# Patient Record
Sex: Female | Born: 1960 | Race: White | Hispanic: No | Marital: Married | Smoking: Current every day smoker
Health system: Southern US, Community
[De-identification: ages and names within clinical notes are randomized; demographics above are authoritative.]

## PROBLEM LIST (undated history)

## (undated) DIAGNOSIS — F419 Anxiety disorder, unspecified: Secondary | ICD-10-CM

## (undated) DIAGNOSIS — F329 Major depressive disorder, single episode, unspecified: Secondary | ICD-10-CM

## (undated) DIAGNOSIS — K219 Gastro-esophageal reflux disease without esophagitis: Secondary | ICD-10-CM

## (undated) DIAGNOSIS — F32A Depression, unspecified: Secondary | ICD-10-CM

## (undated) DIAGNOSIS — H269 Unspecified cataract: Secondary | ICD-10-CM

## (undated) DIAGNOSIS — M1990 Unspecified osteoarthritis, unspecified site: Secondary | ICD-10-CM

## (undated) DIAGNOSIS — G2581 Restless legs syndrome: Secondary | ICD-10-CM

## (undated) DIAGNOSIS — D649 Anemia, unspecified: Secondary | ICD-10-CM

## (undated) HISTORY — DX: Gastro-esophageal reflux disease without esophagitis: K21.9

## (undated) HISTORY — DX: Unspecified osteoarthritis, unspecified site: M19.90

## (undated) HISTORY — PX: SPINAL CORD STIMULATOR REMOVAL: SHX2423

## (undated) HISTORY — DX: Unspecified cataract: H26.9

## (undated) HISTORY — DX: Anemia, unspecified: D64.9

## (undated) HISTORY — DX: Anxiety disorder, unspecified: F41.9

## (undated) HISTORY — DX: Depression, unspecified: F32.A

## (undated) HISTORY — PX: SPINAL CORD STIMULATOR IMPLANT: SHX2422

## (undated) HISTORY — PX: CHOLECYSTECTOMY: SHX55

## (undated) HISTORY — PX: OTHER SURGICAL HISTORY: SHX169

---
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
## 1999-01-22 ENCOUNTER — Other Ambulatory Visit: Admission: RE | Admit: 1999-01-22 | Discharge: 1999-01-22 | Payer: Self-pay

## 2001-11-14 DIAGNOSIS — Z5189 Encounter for other specified aftercare: Secondary | ICD-10-CM

## 2001-11-14 HISTORY — DX: Encounter for other specified aftercare: Z51.89

## 2003-11-15 DIAGNOSIS — G709 Myoneural disorder, unspecified: Secondary | ICD-10-CM

## 2003-11-15 HISTORY — DX: Myoneural disorder, unspecified: G70.9

## 2008-08-04 ENCOUNTER — Ambulatory Visit (HOSPITAL_COMMUNITY): Admission: RE | Admit: 2008-08-04 | Discharge: 2008-08-04 | Payer: Self-pay | Admitting: Anesthesiology

## 2009-01-20 ENCOUNTER — Ambulatory Visit (HOSPITAL_COMMUNITY): Admission: RE | Admit: 2009-01-20 | Discharge: 2009-01-20 | Payer: Self-pay | Admitting: Anesthesiology

## 2009-04-15 ENCOUNTER — Emergency Department (HOSPITAL_COMMUNITY): Admission: EM | Admit: 2009-04-15 | Discharge: 2009-04-15 | Payer: Self-pay | Admitting: Emergency Medicine

## 2009-11-14 HISTORY — PX: OTHER SURGICAL HISTORY: SHX169

## 2010-07-22 ENCOUNTER — Encounter (HOSPITAL_COMMUNITY)
Admission: RE | Admit: 2010-07-22 | Discharge: 2010-10-20 | Payer: Self-pay | Source: Home / Self Care | Admitting: Neurology

## 2011-02-21 LAB — CBC
HCT: 31.4 % — ABNORMAL LOW (ref 36.0–46.0)
Hemoglobin: 10.2 g/dL — ABNORMAL LOW (ref 12.0–15.0)
MCV: 83 fL (ref 78.0–100.0)
Platelets: 360 10*3/uL (ref 150–400)
RBC: 3.79 MIL/uL — ABNORMAL LOW (ref 3.87–5.11)
WBC: 6.3 10*3/uL (ref 4.0–10.5)

## 2011-02-21 LAB — COMPREHENSIVE METABOLIC PANEL
Albumin: 3 g/dL — ABNORMAL LOW (ref 3.5–5.2)
Alkaline Phosphatase: 79 U/L (ref 39–117)
BUN: 4 mg/dL — ABNORMAL LOW (ref 6–23)
CO2: 26 mEq/L (ref 19–32)
Chloride: 109 mEq/L (ref 96–112)
GFR calc non Af Amer: >60 mL/min (ref >60)
Glucose, Bld: 82 mg/dL (ref 70–99)
Potassium: 3.8 mEq/L (ref 3.5–5.1)
Total Bilirubin: 0.3 mg/dL (ref 0.3–1.2)

## 2011-02-21 LAB — POCT CARDIAC MARKERS

## 2011-02-21 LAB — URINALYSIS, ROUTINE W REFLEX MICROSCOPIC
Glucose, UA: NEGATIVE mg/dL
Hgb urine dipstick: NEGATIVE
Specific Gravity, Urine: 1.005 (ref 1.005–1.030)
pH: 5.5 (ref 5.0–8.0)

## 2011-02-21 LAB — LIPASE, BLOOD: Lipase: 61 U/L — ABNORMAL HIGH (ref 11–59)

## 2011-02-21 LAB — DIFFERENTIAL
Basophils Absolute: 0.1 10*3/uL (ref 0.0–0.1)
Basophils Relative: 2 % — ABNORMAL HIGH (ref 0–1)
Monocytes Absolute: 0.6 10*3/uL (ref 0.1–1.0)
Neutro Abs: 3.2 10*3/uL (ref 1.7–7.7)

## 2011-08-12 ENCOUNTER — Other Ambulatory Visit: Payer: Self-pay | Admitting: Neurology

## 2011-08-12 DIAGNOSIS — M545 Low back pain: Secondary | ICD-10-CM

## 2011-08-12 DIAGNOSIS — R269 Unspecified abnormalities of gait and mobility: Secondary | ICD-10-CM

## 2011-08-12 DIAGNOSIS — W19XXXA Unspecified fall, initial encounter: Secondary | ICD-10-CM

## 2011-08-18 ENCOUNTER — Ambulatory Visit
Admission: RE | Admit: 2011-08-18 | Discharge: 2011-08-18 | Disposition: A | Discharge status: Home / Self Care | Payer: Medicare HMO | Source: Ambulatory Visit | Attending: Neurology | Admitting: Neurology

## 2011-08-18 DIAGNOSIS — M545 Low back pain: Secondary | ICD-10-CM

## 2011-08-18 DIAGNOSIS — R269 Unspecified abnormalities of gait and mobility: Secondary | ICD-10-CM

## 2011-08-18 DIAGNOSIS — W19XXXA Unspecified fall, initial encounter: Secondary | ICD-10-CM

## 2011-08-18 MED ORDER — GADOBENATE DIMEGLUMINE 529 MG/ML IV SOLN
19.0000 mL | Freq: Once | INTRAVENOUS | Status: AC | PRN
  Administered 2011-08-18: 19 mL via INTRAVENOUS

## 2012-01-16 DIAGNOSIS — G8929 Other chronic pain: Secondary | ICD-10-CM

## 2012-01-16 DIAGNOSIS — G5790 Unspecified mononeuropathy of unspecified lower limb: Secondary | ICD-10-CM

## 2012-01-16 DIAGNOSIS — M5416 Radiculopathy, lumbar region: Secondary | ICD-10-CM

## 2012-01-16 HISTORY — DX: Unspecified mononeuropathy of unspecified lower limb: G57.90

## 2012-01-16 HISTORY — DX: Radiculopathy, lumbar region: M54.16

## 2012-01-16 HISTORY — DX: Other chronic pain: G89.29

## 2012-08-28 HISTORY — PX: OTHER SURGICAL HISTORY: SHX169

## 2012-10-10 ENCOUNTER — Other Ambulatory Visit: Payer: Self-pay | Admitting: Anesthesiology

## 2012-10-10 DIAGNOSIS — R209 Unspecified disturbances of skin sensation: Secondary | ICD-10-CM

## 2012-10-10 DIAGNOSIS — M542 Cervicalgia: Secondary | ICD-10-CM

## 2012-10-16 ENCOUNTER — Ambulatory Visit (HOSPITAL_COMMUNITY): Admission: RE | Admit: 2012-10-16 | Payer: BC Managed Care – PPO | Source: Ambulatory Visit

## 2012-10-16 ENCOUNTER — Inpatient Hospital Stay (HOSPITAL_COMMUNITY): Admission: RE | Admit: 2012-10-16 | Payer: BC Managed Care – PPO | Source: Ambulatory Visit

## 2012-10-23 ENCOUNTER — Other Ambulatory Visit (HOSPITAL_COMMUNITY): Payer: BC Managed Care – PPO

## 2012-10-23 ENCOUNTER — Ambulatory Visit (HOSPITAL_BASED_OUTPATIENT_CLINIC_OR_DEPARTMENT_OTHER): Payer: Medicare Other | Attending: Anesthesiology | Admitting: Radiology

## 2012-10-23 VITALS — Ht 64.0 in | Wt 200.0 lb

## 2012-10-23 DIAGNOSIS — G2581 Restless legs syndrome: Secondary | ICD-10-CM

## 2012-10-23 DIAGNOSIS — G4737 Central sleep apnea in conditions classified elsewhere: Secondary | ICD-10-CM | POA: Insufficient documentation

## 2012-10-23 DIAGNOSIS — G4733 Obstructive sleep apnea (adult) (pediatric): Secondary | ICD-10-CM | POA: Insufficient documentation

## 2012-10-27 DIAGNOSIS — G4737 Central sleep apnea in conditions classified elsewhere: Secondary | ICD-10-CM

## 2012-10-27 DIAGNOSIS — R0609 Other forms of dyspnea: Secondary | ICD-10-CM

## 2012-10-27 DIAGNOSIS — G4733 Obstructive sleep apnea (adult) (pediatric): Secondary | ICD-10-CM

## 2012-10-27 DIAGNOSIS — R0989 Other specified symptoms and signs involving the circulatory and respiratory systems: Secondary | ICD-10-CM

## 2012-10-27 NOTE — Procedures (Signed)
NAME:  [name], Nancy Cervantes                   ACCOUNT NO.:  [id number]  MEDICAL RECORD NO.:  [id number]          PATIENT TYPE:  OUT  LOCATION:  SLEEP CENTER                 FACILITY:  [facility]  PHYSICIAN:  Clinton D. Maple Hudson, MD, FCCP, FACPDATE OF BIRTH:  1963-03-22  DATE OF STUDY:  10/23/2012                           NOCTURNAL POLYSOMNOGRAM  REFERRING PHYSICIAN:  KWADWO GYARTENG-DAKWA  REFERRING PHYSICIAN:  Tonye Royalty, MD  INDICATION FOR STUDY:  Insomnia with sleep apnea.  EPWORTH SLEEPINESS SCORE:  24/24.  BMI 34.3, weight 200 pounds, height 64 inches, neck 15 inches.  MEDICATIONS:  Home medications are charted and reviewed.  SLEEP ARCHITECTURE:  Total sleep time 325.5 minutes with sleep efficiency 91.3%.  Stage I was 17.8%, stage II 73.3%, stage III absent, REM 8.9% of total sleep time.  Sleep latency 3 minutes, REM latency 202.5 minutes, awake after sleep onset 17 minutes.  Arousal index 29.9. Bedtime medication:  Nexium, pramipexole, ranitidine, baclofen.  RESPIRATORY DATA:  Apnea/hypopnea index (AHI) 26.4 per hour.  A total of 143 events was scored including 19 obstructive apneas, 30 central apneas, 7 mixed apneas, 87 hypopneas.  Events were not positional.  REM AHI 41.4 per hour.  This is a diagnostic NPSG protocol as ordered.  CPAP was not done.  OXYGEN DATA:  Moderately loud snoring with oxygen desaturation to a nadir of 83% and mean oxygen saturation through the study of 92.3% on room air.  CARDIAC DATA:  Sinus rhythm with occasional PAC and PVC.  MOVEMENT/PARASOMNIA:  A few incidental limb jerks were noted without sleep disturbance.  No bathroom trips.  IMPRESSION/RECOMMENDATION: 1. The patient had no difficulty falling asleep for the study with a     sleep latency of 3 minutes after lights out.  Spontaneously awake     at 4 a.m. 2. 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  REM AHI 41.4 per  hour.     Moderately loud snoring with oxygen desaturation to a nadir of 83%     and mean oxygen saturation through the study of 92.3% on room air. 3. This was a diagnostic NPSG protocol as ordered.  Consider return     for dedicated CPAP titration study if appropriate.     Clinton D. Maple Hudson, MD, [facility], FACP Diplomate, American Board of Sleep Medicine    CDY/MEDQ  D:  10/27/2012 11:32:02  T:  10/27/2012 19:03:38  Job:  [id number]

## 2012-12-11 ENCOUNTER — Encounter (HOSPITAL_BASED_OUTPATIENT_CLINIC_OR_DEPARTMENT_OTHER): Payer: Medicare Other

## 2013-01-02 ENCOUNTER — Ambulatory Visit (HOSPITAL_BASED_OUTPATIENT_CLINIC_OR_DEPARTMENT_OTHER): Payer: Medicare Other | Attending: Anesthesiology | Admitting: Radiology

## 2013-01-02 VITALS — Ht 64.0 in | Wt 200.0 lb

## 2013-01-02 DIAGNOSIS — G4710 Hypersomnia, unspecified: Secondary | ICD-10-CM | POA: Insufficient documentation

## 2013-01-02 DIAGNOSIS — Z79899 Other long term (current) drug therapy: Secondary | ICD-10-CM | POA: Insufficient documentation

## 2013-01-06 DIAGNOSIS — G4710 Hypersomnia, unspecified: Secondary | ICD-10-CM

## 2013-01-06 DIAGNOSIS — Z79899 Other long term (current) drug therapy: Secondary | ICD-10-CM

## 2013-01-06 DIAGNOSIS — G4730 Sleep apnea, unspecified: Secondary | ICD-10-CM

## 2013-01-07 NOTE — Procedures (Signed)
NAME:  Nancy Cervantes, Nancy Cervantes                   ACCOUNT NO.:  [id number]  MEDICAL RECORD NO.:  [id number]          PATIENT TYPE:  OUT  LOCATION:  SLEEP CENTER                 FACILITY:  [facility]  PHYSICIAN:  Clinton D. Maple Hudson, MD, FCCP, FACPDATE OF BIRTH:  [birth year]-Oct-1961  DATE OF STUDY:  01/02/2013                           NOCTURNAL POLYSOMNOGRAM  REFERRING PHYSICIAN:  Tonye Royalty, MD  INDICATION FOR STUDY:  Hypersomnia with sleep apnea.  EPWORTH SLEEPINESS SCORE:  24/24.  BMI 34.3, weight 200 pounds, height 64 inches, neck 15 inches.  MEDICATIONS:  Home medications charted and reviewed.  A baseline diagnostic NPSG on October 23, 2012, had recorded AHI 26.4 per hour. Body weight was 200 pounds.  CPAP titration is now requested.  SLEEP ARCHITECTURE:  Total sleep time 297 minutes with sleep efficiency 78.8%.  Stage I was 12.6%, stage II 75.8%, stage III 2.2%, REM 9.4% of total sleep time.  Sleep latency 10 minutes, REM latency 165.5 minutes, awake after sleep onset 70 minutes, arousal index 40.  BEDTIME MEDICATION:  Ranitidine, pramipexole, baclofen, Nexium.  Sleep was marked by frequent brief waking throughout the night.  RESPIRATORY DATA:  CPAP titration protocol.  CPAP was titrated to 8 CWP, but was not tolerated.  The technician switched her to bilevel (BiPAP) and she did much better, titrating to a final inspiratory pressure of 17 and expiratory pressure of 13 with residual AHI 8.1 per hour.  She wore a small ResMed Quattro FX full-face mask with heated humidifier.  OXYGEN DATA:  Snoring was prevented at final bilevel settings and mean oxygen saturation held 94.2% on room air.  CARDIAC DATA:  Normal sinus rhythm.  MOVEMENT-PARASOMNIA:  No significant movement disturbance.  No bathroom trips.  IMPRESSIONS-RECOMMENDATIONS: 1. The patient did not tolerate CPAP.  Successful bilevel (BiPAP)     titration to inspiratory 17 and expiratory 13 CWP.  Residual AHI     was 8.1 per hour.   She wore a small ResMed Quattro FX full-face     mask with heated humidifier. 2. Baseline diagnostic NPSG on October 23, 2012, had recorded AHI     26.4 per hour.  Body weight was 200 pounds for that study.     Clinton D. Maple Hudson, MD, [facility], FACP Diplomate, American Board of Sleep Medicine    CDY/MEDQ  D:  01/06/2013 12:59:33  T:  01/07/2013 00:34:02  Job:  [id number]

## 2013-02-06 ENCOUNTER — Telehealth (HOSPITAL_BASED_OUTPATIENT_CLINIC_OR_DEPARTMENT_OTHER): Payer: Self-pay | Admitting: Radiology

## 2013-02-06 NOTE — Telephone Encounter (Signed)
The tech spoke with Ann(PCC) at [facility] Regarding PAP therapy for Ms. Nham. The tech ask permission via T/O to change from CPAP to Bi-Level per recommendation of interpreting physician. The order was changed from CPAP to Bi-Level of 17cm/13cm H20.

## 2013-03-23 ENCOUNTER — Other Ambulatory Visit (HOSPITAL_COMMUNITY): Payer: Self-pay | Admitting: Neurology

## 2015-02-05 DIAGNOSIS — R208 Other disturbances of skin sensation: Secondary | ICD-10-CM

## 2015-02-05 HISTORY — DX: Other disturbances of skin sensation: R20.8

## 2016-01-13 DIAGNOSIS — G4700 Insomnia, unspecified: Secondary | ICD-10-CM

## 2016-01-13 HISTORY — DX: Insomnia, unspecified: G47.00

## 2016-01-21 ENCOUNTER — Ambulatory Visit: Payer: Self-pay | Admitting: Sports Medicine

## 2016-05-19 DIAGNOSIS — Z8711 Personal history of peptic ulcer disease: Secondary | ICD-10-CM

## 2016-05-19 HISTORY — DX: Personal history of peptic ulcer disease: Z87.11

## 2016-08-17 DIAGNOSIS — E039 Hypothyroidism, unspecified: Secondary | ICD-10-CM

## 2016-08-17 HISTORY — DX: Hypothyroidism, unspecified: E03.9

## 2016-08-29 DIAGNOSIS — F32A Depression, unspecified: Secondary | ICD-10-CM | POA: Insufficient documentation

## 2017-04-19 DIAGNOSIS — R10A1 Flank pain, right side: Secondary | ICD-10-CM

## 2017-04-19 DIAGNOSIS — R109 Unspecified abdominal pain: Secondary | ICD-10-CM

## 2017-04-19 HISTORY — DX: Unspecified abdominal pain: R10.9

## 2017-04-19 HISTORY — DX: Flank pain, right side: R10.A1

## 2018-01-23 DIAGNOSIS — G2581 Restless legs syndrome: Secondary | ICD-10-CM

## 2018-01-23 DIAGNOSIS — R310 Gross hematuria: Secondary | ICD-10-CM

## 2018-01-23 HISTORY — DX: Gross hematuria: R31.0

## 2018-01-23 HISTORY — DX: Restless legs syndrome: G25.81

## 2018-04-17 DIAGNOSIS — R42 Dizziness and giddiness: Secondary | ICD-10-CM

## 2018-04-17 HISTORY — DX: Dizziness and giddiness: R42

## 2018-04-24 DIAGNOSIS — G609 Hereditary and idiopathic neuropathy, unspecified: Secondary | ICD-10-CM

## 2018-04-24 DIAGNOSIS — M79604 Pain in right leg: Secondary | ICD-10-CM

## 2018-04-24 DIAGNOSIS — M79605 Pain in left leg: Secondary | ICD-10-CM | POA: Insufficient documentation

## 2018-04-24 HISTORY — DX: Pain in right leg: M79.604

## 2018-04-24 HISTORY — DX: Hereditary and idiopathic neuropathy, unspecified: G60.9

## 2018-07-23 ENCOUNTER — Emergency Department (HOSPITAL_COMMUNITY): Payer: Medicare HMO

## 2018-07-23 ENCOUNTER — Encounter (HOSPITAL_COMMUNITY): Payer: Self-pay | Admitting: Emergency Medicine

## 2018-07-23 ENCOUNTER — Emergency Department (HOSPITAL_COMMUNITY)
Admission: EM | Admit: 2018-07-23 | Discharge: 2018-07-23 | Disposition: A | Discharge status: Home / Self Care | Payer: Medicare HMO | Attending: Emergency Medicine | Admitting: Emergency Medicine

## 2018-07-23 DIAGNOSIS — F17200 Nicotine dependence, unspecified, uncomplicated: Secondary | ICD-10-CM | POA: Diagnosis not present

## 2018-07-23 DIAGNOSIS — R200 Anesthesia of skin: Secondary | ICD-10-CM | POA: Diagnosis not present

## 2018-07-23 DIAGNOSIS — Z91040 Latex allergy status: Secondary | ICD-10-CM | POA: Diagnosis not present

## 2018-07-23 DIAGNOSIS — Z79899 Other long term (current) drug therapy: Secondary | ICD-10-CM | POA: Insufficient documentation

## 2018-07-23 DIAGNOSIS — G43919 Migraine, unspecified, intractable, without status migrainosus: Secondary | ICD-10-CM | POA: Insufficient documentation

## 2018-07-23 DIAGNOSIS — G43109 Migraine with aura, not intractable, without status migrainosus: Secondary | ICD-10-CM | POA: Diagnosis not present

## 2018-07-23 DIAGNOSIS — M5481 Occipital neuralgia: Secondary | ICD-10-CM | POA: Diagnosis not present

## 2018-07-23 DIAGNOSIS — R531 Weakness: Secondary | ICD-10-CM | POA: Diagnosis not present

## 2018-07-23 DIAGNOSIS — R4781 Slurred speech: Secondary | ICD-10-CM | POA: Insufficient documentation

## 2018-07-23 DIAGNOSIS — R51 Headache: Secondary | ICD-10-CM | POA: Diagnosis present

## 2018-07-23 HISTORY — DX: Restless legs syndrome: G25.81

## 2018-07-23 LAB — I-STAT CHEM 8, ED
BUN: 14 mg/dL (ref 6–20)
CREATININE: 0.7 mg/dL (ref 0.44–1.00)
Calcium, Ion: 1.06 mmol/L — ABNORMAL LOW (ref 1.15–1.40)
Chloride: 109 mmol/L (ref 98–111)
Glucose, Bld: 125 mg/dL — ABNORMAL HIGH (ref 70–99)
HEMATOCRIT: 39 % (ref 36.0–46.0)
Hemoglobin: 13.3 g/dL (ref 12.0–15.0)
POTASSIUM: 3.7 mmol/L (ref 3.5–5.1)
SODIUM: 143 mmol/L (ref 135–145)
TCO2: 26 mmol/L (ref 22–32)

## 2018-07-23 LAB — URINALYSIS, ROUTINE W REFLEX MICROSCOPIC
BACTERIA UA: NONE SEEN
BILIRUBIN URINE: NEGATIVE
GLUCOSE, UA: NEGATIVE mg/dL
Ketones, ur: NEGATIVE mg/dL
LEUKOCYTES UA: NEGATIVE
NITRITE: NEGATIVE
PH: 5 (ref 5.0–8.0)
Protein, ur: NEGATIVE mg/dL
SPECIFIC GRAVITY, URINE: 1.013 (ref 1.005–1.030)

## 2018-07-23 LAB — COMPREHENSIVE METABOLIC PANEL
ALT: 18 U/L (ref 0–44)
AST: 21 U/L (ref 15–41)
Albumin: 3.5 g/dL (ref 3.5–5.0)
Alkaline Phosphatase: 92 U/L (ref 38–126)
Anion gap: 12 (ref 5–15)
BUN: 13 mg/dL (ref 6–20)
CO2: 23 mmol/L (ref 22–32)
CREATININE: 0.87 mg/dL (ref 0.44–1.00)
Calcium: 8.8 mg/dL — ABNORMAL LOW (ref 8.9–10.3)
Chloride: 108 mmol/L (ref 98–111)
GFR calc Af Amer: >60 mL/min (ref >60)
Glucose, Bld: 126 mg/dL — ABNORMAL HIGH (ref 70–99)
POTASSIUM: 3.8 mmol/L (ref 3.5–5.1)
Sodium: 143 mmol/L (ref 135–145)
Total Bilirubin: 0.4 mg/dL (ref 0.3–1.2)
Total Protein: 6.3 g/dL — ABNORMAL LOW (ref 6.5–8.1)

## 2018-07-23 LAB — CBC
HCT: 41.2 % (ref 36.0–46.0)
HEMOGLOBIN: 13.8 g/dL (ref 12.0–15.0)
MCH: 30.9 pg (ref 26.0–34.0)
MCHC: 33.5 g/dL (ref 30.0–36.0)
MCV: 92.4 fL (ref 78.0–100.0)
PLATELETS: 232 10*3/uL (ref 150–400)
RBC: 4.46 MIL/uL (ref 3.87–5.11)
RDW: 11.8 % (ref 11.5–15.5)
WBC: 7.3 10*3/uL (ref 4.0–10.5)

## 2018-07-23 LAB — DIFFERENTIAL
ABS IMMATURE GRANULOCYTES: 0 10*3/uL (ref 0.0–0.1)
BASOS PCT: 1 %
Basophils Absolute: 0.1 10*3/uL (ref 0.0–0.1)
EOS ABS: 0.2 10*3/uL (ref 0.0–0.7)
Eosinophils Relative: 3 %
IMMATURE GRANULOCYTES: 0 %
Lymphocytes Relative: 30 %
Lymphs Abs: 2.2 10*3/uL (ref 0.7–4.0)
MONOS PCT: 8 %
Monocytes Absolute: 0.6 10*3/uL (ref 0.1–1.0)
NEUTROS ABS: 4.3 10*3/uL (ref 1.7–7.7)
NEUTROS PCT: 58 %

## 2018-07-23 LAB — I-STAT TROPONIN, ED: TROPONIN I, POC: 0 ng/mL (ref 0.00–0.08)

## 2018-07-23 LAB — RAPID URINE DRUG SCREEN, HOSP PERFORMED
AMPHETAMINES: NOT DETECTED
BENZODIAZEPINES: NOT DETECTED
Barbiturates: NOT DETECTED
COCAINE: NOT DETECTED
Opiates: NOT DETECTED
TETRAHYDROCANNABINOL: NOT DETECTED

## 2018-07-23 LAB — I-STAT BETA HCG BLOOD, ED (MC, WL, AP ONLY): I-stat hCG, quantitative: <5 m[IU]/mL (ref <5)

## 2018-07-23 LAB — APTT: APTT: 28 s (ref 24–36)

## 2018-07-23 LAB — PROTIME-INR
INR: 1.03
PROTHROMBIN TIME: 13.4 s (ref 11.4–15.2)

## 2018-07-23 LAB — ETHANOL: Alcohol, Ethyl (B): <10 mg/dL (ref <10)

## 2018-07-23 MED ORDER — SODIUM CHLORIDE 0.9 % IV BOLUS
500.0000 mL | Freq: Once | INTRAVENOUS | Status: AC
  Administered 2018-07-23: 500 mL via INTRAVENOUS

## 2018-07-23 MED ORDER — DIPHENHYDRAMINE HCL 50 MG/ML IJ SOLN
25.0000 mg | Freq: Once | INTRAMUSCULAR | Status: AC
  Administered 2018-07-23: 25 mg via INTRAVENOUS
  Filled 2018-07-23: qty 1

## 2018-07-23 MED ORDER — LORAZEPAM 2 MG/ML IJ SOLN
0.5000 mg | Freq: Once | INTRAMUSCULAR | Status: AC
  Administered 2018-07-23: 0.5 mg via INTRAVENOUS
  Filled 2018-07-23: qty 1

## 2018-07-23 MED ORDER — PROCHLORPERAZINE EDISYLATE 10 MG/2ML IJ SOLN
10.0000 mg | Freq: Once | INTRAMUSCULAR | Status: AC
  Administered 2018-07-23: 10 mg via INTRAVENOUS
  Filled 2018-07-23: qty 2

## 2018-07-23 NOTE — ED Notes (Addendum)
Pt returned from MRI at this time, resting comfortably.

## 2018-07-23 NOTE — ED Notes (Signed)
Pt ambulatory to bathroom with one assist.

## 2018-07-23 NOTE — ED Provider Notes (Signed)
[facility] EMERGENCY DEPARTMENT Provider Note   CSN: [id number] Arrival date & time: 07/23/18  1436   An emergency department physician performed an initial assessment on this suspected stroke patient at 1430.  History   Chief Complaint Chief Complaint  Patient presents with  . Code Stroke    HPI Nancy Cervantes is a 57 y.o. female.  57 yo F with a chief complaint of a right-sided headache and left-sided weakness.  Going on for the past 5 days.  Felt that the headache got worse today as she was driving in her car and so came to the emergency department.  Was made a code stroke upon arrival.  By my assessment the patient is complaining of her worsening headache.  Described as right-sided and throbbing.  Not sure what makes it better or worse.  Denies nausea or vomiting.  Describes left-sided weakness arm and leg.  Also describes numbness where she cannot feel.  Denies paresthesias.  Pain is worse to the posterior aspect of the right side of her head and radiates towards her face.  Makes the left  Eye feel like it is full of pressure.  The history is provided by the patient.  Illness  This is a new problem. The current episode started more than 2 days ago. The problem occurs constantly. The problem has been gradually worsening. Associated symptoms include headaches. Pertinent negatives include no chest pain and no shortness of breath. Nothing aggravates the symptoms. Nothing relieves the symptoms. She has tried nothing for the symptoms. The treatment provided no relief.    Past Medical History:  Diagnosis Date  . Restless leg syndrome     There are no active problems to display for this patient.   History reviewed. No pertinent surgical history.   OB History   None      Home Medications    Prior to Admission medications   Medication Sig Start Date End Date Taking? Authorizing Provider  esomeprazole (NEXIUM) 40 MG capsule Take 40 mg by mouth daily.  04/27/18   Yes [provider]    Family History No family history on file.  Social History Social History   Tobacco Use  . Smoking status: Current Every Day Smoker  Substance Use Topics  . Alcohol use: Not on file  . Drug use: Never     Allergies   Gabapentin; Latex; Morphine and related; Neomycin-bacitracin zn-polymyx; Pregabalin; and Topiramate   Review of Systems Review of Systems  Constitutional: Negative for chills and fever.  HENT: Negative for congestion and rhinorrhea.   Eyes: Negative for redness and visual disturbance.  Respiratory: Negative for shortness of breath and wheezing.   Cardiovascular: Negative for chest pain and palpitations.  Gastrointestinal: Negative for nausea and vomiting.  Genitourinary: Negative for dysuria and urgency.  Musculoskeletal: Negative for arthralgias and myalgias.  Skin: Negative for pallor and wound.  Neurological: Positive for weakness, numbness and headaches. Negative for dizziness.     Physical Exam Updated Vital Signs BP 133/66   Pulse 75   Temp 98.4 F (36.9 C) (Oral)   Resp 16   Ht 5\' 4"  (1.626 m)   Wt 77 kg   SpO2 96%   BMI 29.14 kg/m   Physical Exam  Constitutional: She is oriented to person, place, and time. She appears well-developed and well-nourished. No distress.  HENT:  Head: Normocephalic and atraumatic.  Eyes: Pupils are equal, round, and reactive to light. EOM are normal.  Neck: Normal range of  motion. Neck supple.  Cardiovascular: Normal rate and regular rhythm. Exam reveals no gallop and no friction rub.  No murmur heard. Pulmonary/Chest: Effort normal. She has no wheezes. She has no rales.  Abdominal: Soft. She exhibits no distension. There is no tenderness.  Musculoskeletal: She exhibits no edema or tenderness.  Neurological: She is alert and oriented to person, place, and time. Coordination and gait normal.  4/5 LUE compared to right.  Subjective decreased sensation to light touch to the right  side of face.   Skin: Skin is warm and dry. She is not diaphoretic.  Psychiatric: She has a normal mood and affect. Her behavior is normal.  Nursing note and vitals reviewed.    ED Treatments / Results  Labs (all labs ordered are listed, but only abnormal results are displayed) Labs Reviewed  COMPREHENSIVE METABOLIC PANEL - Abnormal; Notable for the following components:      Result Value   Glucose, Bld 126 (*)    Calcium 8.8 (*)    Total Protein 6.3 (*)    All other components within normal limits  URINALYSIS, ROUTINE W REFLEX MICROSCOPIC - Abnormal; Notable for the following components:   Hgb urine dipstick MODERATE (*)    All other components within normal limits  I-STAT CHEM 8, ED - Abnormal; Notable for the following components:   Glucose, Bld 125 (*)    Calcium, Ion 1.06 (*)    All other components within normal limits  ETHANOL  PROTIME-INR  APTT  CBC  DIFFERENTIAL  RAPID URINE DRUG SCREEN, HOSP PERFORMED  I-STAT TROPONIN, ED  I-STAT BETA HCG BLOOD, ED ([facility], [facility], [facility] ONLY)    EKG EKG Interpretation  Date/Time:  Monday July 23 2018 15:02:21 EDT Ventricular Rate:  108 PR Interval:    QRS Duration: 85 QT Interval:  338 QTC Calculation: 453 R Axis:   86 Text Interpretation:  Sinus tachycardia Low voltage, precordial leads Since last tracing rate faster Confirmed by Deno Etienne [phone]) on 07/23/2018 3:13:47 PM   Radiology Mr Brain Wo Contrast  Result Date: 07/23/2018 CLINICAL DATA:  Initial evaluation for acute left-sided numbness. EXAM: MRI HEAD WITHOUT CONTRAST TECHNIQUE: Multiplanar, multiecho pulse sequences of the brain and surrounding structures were obtained without intravenous contrast. COMPARISON:  Prior CT from earlier the same day. FINDINGS: Brain: Examination severely limited by motion artifact. Additionally, patient was unable to tolerate the full length of the exam. Diffusion-weighted sequences with sagittal T1 and T2 sequences were performed.  Diffusion-weighted imaging demonstrates no definite evidence for acute or subacute ischemia. Gray-white matter differentiation grossly maintained. No obvious other acute intracranial abnormality, although evaluation limited on this limited in motion degraded study. Gray-white matter differentiation grossly maintained. No obvious mass lesion. No midline shift or appreciable mass effect. Ventricles grossly normal in size without hydrocephalus. No definite extra-axial collection. Vascular: Major intracranial vascular flow voids grossly maintained at the skull base. Skull and upper cervical spine: Craniocervical junction poorly assessed on this limited exam. No obvious focal marrow replacing lesion. Scalp soft tissues unremarkable. Sinuses/Orbits: Globes and orbital soft tissues poorly assessed due to motion. Paranasal sinuses and mastoid air cells are grossly clear. Other: None. IMPRESSION: 1. Severely limited study due to patient's inability to tolerate the full length of the exam and extensive motion artifact. 2. No definite acute intracranial infarct. No other appreciable acute intracranial abnormality. Electronically Signed   By: Jeannine Boga M.D.   On: 07/23/2018 20:41   Ct Head Code Stroke Wo Contrast  Result Date: 07/23/2018 CLINICAL  DATA:  Code stroke. Slurred speech, left-sided weakness. Headache. EXAM: CT HEAD WITHOUT CONTRAST TECHNIQUE: Contiguous axial images were obtained from the base of the skull through the vertex without intravenous contrast. COMPARISON:  None. FINDINGS: Brain: Negative for acute infarct. Negative for acute hemorrhage or mass. Negative for hydrocephalus. No significant chronic ischemia. Vascular: Negative for hyperdense vessel Skull: Negative Sinuses/Orbits: Negative Other: None ASPECTS ([place] Stroke Program Early CT Score) - Ganglionic level infarction (caudate, lentiform nuclei, internal capsule, insula, M1-M3 cortex): 7 - Supraganglionic infarction (M4-M6 cortex): 3  Total score (0-10 with 10 being normal): 10 IMPRESSION: 1. Negative CT head 2. ASPECTS is 10 3. These results were called by telephone at the time of interpretation on 07/23/2018 at 2:59 pm to Dr. Erlinda Hong, who verbally acknowledged these results. Electronically Signed   By: Franchot Gallo M.D.   On: 07/23/2018 14:59    Procedures Procedures (including critical care time)  Medications Ordered in ED Medications  prochlorperazine (COMPAZINE) injection 10 mg (10 mg Intravenous Given 07/23/18 1525)  diphenhydrAMINE (BENADRYL) injection 25 mg (25 mg Intravenous Given 07/23/18 1525)  sodium chloride 0.9 % bolus 500 mL (0 mLs Intravenous Stopped 07/23/18 1621)  diphenhydrAMINE (BENADRYL) injection 25 mg (25 mg Intravenous Given 07/23/18 1759)  LORazepam (ATIVAN) injection 0.5 mg (0.5 mg Intravenous Given 07/23/18 1800)     Initial Impression / Assessment and Plan / ED Course  I have reviewed the triage vital signs and the nursing notes.  Pertinent labs & imaging results that were available during my care of the patient were reviewed by me and considered in my medical decision making (see chart for details).     57 yo F with a chief complaints of right-sided headache and left-sided numbness.  This been going on for the past 5 days.  Patient has had worsening of her headache today while she was driving her car.  She feels that she cannot feel the left side of her body.  Feels that side is weak.  Was seen by the neurologist with no noted weakness.  He felt this is most likely a complicated migraine recommended a MRI to ensure that this was not a stroke and recommended treatment with a headache cocktail.  Will give Compazine and Benadryl.  Will reassess.  Patient headache improved.  MR motion limited but likely negative for acute stroke.  D/c home. Outpatient neuro follow up.   11:11 PM:  I have discussed the diagnosis/risks/treatment options with the patient and family and believe the pt to be eligible for discharge  home to follow-up with Neuro, PCP. We also discussed returning to the ED immediately if new or worsening sx occur. We discussed the sx which are most concerning (e.g., sudden worsening pain, fever, inability to tolerate by mouth) that necessitate immediate return. Medications administered to the patient during their visit and any new prescriptions provided to the patient are listed below.  Medications given during this visit Medications  prochlorperazine (COMPAZINE) injection 10 mg (10 mg Intravenous Given 07/23/18 1525)  diphenhydrAMINE (BENADRYL) injection 25 mg (25 mg Intravenous Given 07/23/18 1525)  sodium chloride 0.9 % bolus 500 mL (0 mLs Intravenous Stopped 07/23/18 1621)  diphenhydrAMINE (BENADRYL) injection 25 mg (25 mg Intravenous Given 07/23/18 1759)  LORazepam (ATIVAN) injection 0.5 mg (0.5 mg Intravenous Given 07/23/18 1800)     The patient appears reasonably screen and/or stabilized for discharge and I doubt any other medical condition or other [facility] requiring further screening, evaluation, or treatment in the ED at this time  prior to discharge.    Final Clinical Impressions(s) / ED Diagnoses   Final diagnoses:  Complicated migraine    ED Discharge Orders         Ordered    Ambulatory referral to Neurology     07/23/18 2142           Deno Etienne, DO 07/23/18 2311

## 2018-07-23 NOTE — ED Notes (Signed)
Patient transported to MRI 

## 2018-07-23 NOTE — Consult Note (Signed)
Stroke Neurology Consultation Note  Consult Requested by: EDP  Reason for Consult: headache, left sided numbness  Consult Date: 07/23/18  The history was obtained from the EMS and pt.  During history and examination, all items were able to obtain unless otherwise noted.  History of Present Illness:  Nancy Cervantes is a 57 y.o. Caucasian female with PMH of RLE, breast mass, hematuria, peripheral neuropathy, anxiety and depression presented as code stroke.  Pt stated that she had HA and left sided numbness but mild for the last 4-5 days. The HA was at occipital area with intermittent sharp pain radiating to both temporal region with difficulty with visual focusing. Today she was driving in the highway, HA at back of head getting worse with sharp radiating pain again and also not able to focus with eyes and worsening left sided numbness. She pulled over and called her son and her son felt that she had slurry speech. EMS was called and pt was code stroked. On EMS arrival, her slurry speech resolved, CBG 125 and BP 140/90. Still complains of HA and left sided numbness.   She smokes 1PPD for 30 years, denies alcohol or illicit drugs.   LSN: 4-5 days ago with worsening 11:50 today tPA Given: No: out of window, mild symptoms, likely non-stroke  Past Medical History:  Diagnosis Date  . Restless leg syndrome     History reviewed. No pertinent surgical history.  No family history on file.  Social History:  reports that she has been smoking. She does not have any smokeless tobacco history on file. She reports that she does not use drugs. Her alcohol history is not on file.  Allergies:  Allergies  Allergen Reactions  . Morphine And Related Palpitations    No current facility-administered medications on file prior to encounter.    No current outpatient medications on file prior to encounter.    Review of Systems: A full ROS was attempted today and was able to be performed.  Systems assessed  include - Constitutional, Eyes,  HENT, Respiratory, Cardiovascular, Gastrointestinal, Genitourinary, Integument/breast, Hematologic/lymphatic, Musculoskeletal, Neurological, Behavioral/Psych, Endocrine, Allergic/Immunologic - with pertinent responses as per HPI.  Physical Examination: Temp:  [98.4 F (36.9 C)] 98.4 F (36.9 C) (09/09 1505) Pulse Rate:  [109] 109 (09/09 1515) Resp:  [12-20] 20 (09/09 1515) BP: (116-121)/(73-77) 121/73 (09/09 1515) SpO2:  [92 %-97 %] 97 % (09/09 1515) Weight:  [77 kg] 77 kg (09/09 1506)  General - well nourished, well developed, in acute distress due to HA.    Ophthalmologic - fundi not visualized due to noncooperation.    Cardiovascular - regular rate and rhythm  Mental Status -  Level of arousal and orientation to time, place, and person were intact. Language including expression, naming, repetition, comprehension, reading, and writing was assessed and found intact. Fund of Knowledge was assessed and was intact.  Cranial Nerves II - XII - II - Vision intact OU. III, IV, VI - Extraocular movements intact. V - Facial sensation diminished on the left, but midline split with tuning fork testing. VII - Facial movement intact bilaterally. VIII - Hearing & vestibular intact bilaterally. X - Palate elevates symmetrically. XI - Chin turning & shoulder shrug intact bilaterally. XII - Tongue protrusion intact.  Motor Strength - The patient's strength was normal in all extremities and pronator drift was absent.   Motor Tone & Bulk - Muscle tone was assessed at the neck and appendages and was normal.  Bulk was normal and fasciculations were  absent.   Reflexes - The patient's reflexes were normal in all extremities and she had no pathological reflexes.  Sensory - Light touch, temperature/pinprick were assessed and were subjective diminished, however, able to do FTN with LUE and HTS with LLE with eye closed. Yes or no test negative    Coordination - The  patient had normal movements in the hands and feet with no ataxia or dysmetria.  Tremor was absent.  Gait and Station - deferred  NIH Stroke Scale  Level Of Consciousness 0=Alert; keenly responsive 1=Not alert, but arousable by minor stimulation 2=Not alert, requires repeated stimulation 3=Responds only with reflex movements 0  LOC Questions to Month and Age 47=Answers both questions correctly 1=Answers one question correctly 2=Answers neither question correctly 0  LOC Commands      -Open/Close eyes     -Open/close grip 0=Performs both tasks correctly 1=Performs one task correctly 2=Performs neighter task correctly 0  Best Gaze 0=Normal 1=Partial gaze palsy 2=Forced deviation, or total gaze paresis 0  Visual 0=No visual loss 1=Partial hemianopia 2=Complete hemianopia 3=Bilateral hemianopia (blind including cortical blindness) 0  Facial Palsy 0=Normal symmetrical movement 1=Minor paralysis (asymmetry) 2=Partial paralysis (lower face) 3=Complete paralysis (upper and lower face) 0  Motor  0=No drift, limb holds posture for full 10 seconds 1=Drift, limb holds posture, no drift to bed 2=Some antigravity effort, cannot maintain posture, drifts to bed 3=No effort against gravity, limb falls 4=No movement Right Arm 0     Leg 0    Left Arm 0     Leg 0  Limb Ataxia 0=Absent 1=Present in one limb 2=Present in two limbs 0  Sensory 0=Normal 1=Mild to moderate sensory loss 2=Severe to total sensory loss 2  Best Language 0=No aphasia, normal 1=Mild to moderate aphasia 2=Mute, global aphasia 3=Mute, global aphasia 0  Dysarthria 0=Normal 1=Mild to moderate 2=Severe, unintelligible or mute/anarthric 0  Extinction/Neglect 0=No abnormality 1=Extinction to bilateral simultaneous stimulation 2=Profound neglect 1  Total   3     Data Reviewed: Ct Head Code Stroke Wo Contrast  Result Date: 07/23/2018 CLINICAL DATA:  Code stroke. Slurred speech, left-sided weakness. Headache. EXAM:  CT HEAD WITHOUT CONTRAST TECHNIQUE: Contiguous axial images were obtained from the base of the skull through the vertex without intravenous contrast. COMPARISON:  None. FINDINGS: Brain: Negative for acute infarct. Negative for acute hemorrhage or mass. Negative for hydrocephalus. No significant chronic ischemia. Vascular: Negative for hyperdense vessel Skull: Negative Sinuses/Orbits: Negative Other: None ASPECTS ([place] Stroke Program Early CT Score) - Ganglionic level infarction (caudate, lentiform nuclei, internal capsule, insula, M1-M3 cortex): 7 - Supraganglionic infarction (M4-M6 cortex): 3 Total score (0-10 with 10 being normal): 10 IMPRESSION: 1. Negative CT head 2. ASPECTS is 10 3. These results were called by telephone at the time of interpretation on 07/23/2018 at 2:59 pm to Dr. Erlinda Hong, who verbally acknowledged these results. Electronically Signed   By: Franchot Gallo M.D.   On: 07/23/2018 14:59    Assessment: 57 y.o. female with PMH of RLE, breast mass, hematuria, peripheral neuropathy, anxiety and depression presented with occipital HA, radiating pain to b/l temporal and left sided numbness for 4-5 days and getting worse this am while driving. Exam showed occipital neuralgia L>R, left side sensory subjective diminished, but midline split with frontal tuning fork testing and able to do FTN and HTS with eye closed. NIHSS = 3  Not tPA candidate as out of window, mild symptoms, symptoms likely occipital neuralgia with complicated migraine. However, right thalamic stroke not excluded  for now. CT negative. No need stat CTA head and neck as no LVO symptoms or signs. Will need stat MRI to rule out right thalamic infarct.  Stroke Risk Factors - smoking  Plan: - MRI brain stat to rule out right thalamic infarct - treat with HA cocktail for better pain management - neuro check - if MRI positive for stroke she needs to be admitted for stroke work up - if MRI negative, and her HA and left side numbness  getting better after treatment, she can be discharged from neuro standpoint and follow up urgently as outpt. May consider occipital nerve block as outpt.  - will follow.  Thank you for this consultation and allowing [place] to participate in the care of this patient.  Rosalin Hawking, MD PhD Stroke Neurology 07/23/2018 3:40 PM

## 2018-07-23 NOTE — ED Triage Notes (Signed)
Pt arrives via EMS with reports of Left side numbness including face, arm and leg. LSN 13:50. Slurred speech was reported but none noticed. Pt complaining of severe headache.

## 2018-07-23 NOTE — Code Documentation (Signed)
56 yo female coming from driving her car when she noted that she had a sudden onset of blurred vision and left sided numbness. Pt reports feeling generalized weakness x2-3 days before these symptoms happened along with a severe headache. Pt called her son when symptoms happened and son reported some slurred speech. EMS called and activated a Code Stroke. Stroke Team met patient upon arrival to the ED. Initial NIHSS 3 due to sensory neglect and sensory decreased on the left side. CT Head completed and showed no hemorrhage. Not a tPA candidate due to too mild to treat along with low suspicion for stroke. Pt to get MRI and have further work up in the ED. Handoff given to Burman Nieves, Therapist, sports.

## 2018-11-16 DIAGNOSIS — M5412 Radiculopathy, cervical region: Secondary | ICD-10-CM | POA: Diagnosis not present

## 2018-11-19 DIAGNOSIS — M5010 Cervical disc disorder with radiculopathy, unspecified cervical region: Secondary | ICD-10-CM | POA: Diagnosis not present

## 2018-11-19 DIAGNOSIS — Z01810 Encounter for preprocedural cardiovascular examination: Secondary | ICD-10-CM | POA: Diagnosis not present

## 2018-11-19 DIAGNOSIS — Z720 Tobacco use: Secondary | ICD-10-CM | POA: Diagnosis not present

## 2018-11-19 DIAGNOSIS — L299 Pruritus, unspecified: Secondary | ICD-10-CM | POA: Diagnosis not present

## 2018-11-22 DIAGNOSIS — Z01818 Encounter for other preprocedural examination: Secondary | ICD-10-CM | POA: Diagnosis not present

## 2018-11-22 DIAGNOSIS — M79609 Pain in unspecified limb: Secondary | ICD-10-CM | POA: Diagnosis not present

## 2018-11-22 DIAGNOSIS — Z01810 Encounter for preprocedural cardiovascular examination: Secondary | ICD-10-CM | POA: Diagnosis not present

## 2018-11-22 DIAGNOSIS — R52 Pain, unspecified: Secondary | ICD-10-CM | POA: Diagnosis not present

## 2018-11-22 DIAGNOSIS — E559 Vitamin D deficiency, unspecified: Secondary | ICD-10-CM | POA: Diagnosis not present

## 2018-11-22 DIAGNOSIS — Z79899 Other long term (current) drug therapy: Secondary | ICD-10-CM | POA: Diagnosis not present

## 2018-12-11 DIAGNOSIS — M5412 Radiculopathy, cervical region: Secondary | ICD-10-CM | POA: Diagnosis not present

## 2018-12-11 DIAGNOSIS — M4802 Spinal stenosis, cervical region: Secondary | ICD-10-CM | POA: Diagnosis not present

## 2018-12-11 DIAGNOSIS — Z01818 Encounter for other preprocedural examination: Secondary | ICD-10-CM | POA: Diagnosis not present

## 2018-12-17 DIAGNOSIS — M50123 Cervical disc disorder at C6-C7 level with radiculopathy: Secondary | ICD-10-CM | POA: Diagnosis not present

## 2018-12-17 DIAGNOSIS — M5412 Radiculopathy, cervical region: Secondary | ICD-10-CM | POA: Diagnosis not present

## 2018-12-17 DIAGNOSIS — M4722 Other spondylosis with radiculopathy, cervical region: Secondary | ICD-10-CM | POA: Diagnosis not present

## 2018-12-17 DIAGNOSIS — M4802 Spinal stenosis, cervical region: Secondary | ICD-10-CM | POA: Diagnosis not present

## 2018-12-17 DIAGNOSIS — R209 Unspecified disturbances of skin sensation: Secondary | ICD-10-CM | POA: Diagnosis not present

## 2018-12-17 DIAGNOSIS — M50222 Other cervical disc displacement at C5-C6 level: Secondary | ICD-10-CM | POA: Diagnosis not present

## 2018-12-17 DIAGNOSIS — G8929 Other chronic pain: Secondary | ICD-10-CM | POA: Diagnosis not present

## 2018-12-17 DIAGNOSIS — M5090 Cervical disc disorder, unspecified, unspecified cervical region: Secondary | ICD-10-CM | POA: Diagnosis not present

## 2018-12-17 DIAGNOSIS — G629 Polyneuropathy, unspecified: Secondary | ICD-10-CM | POA: Diagnosis not present

## 2018-12-17 DIAGNOSIS — M50122 Cervical disc disorder at C5-C6 level with radiculopathy: Secondary | ICD-10-CM | POA: Diagnosis not present

## 2018-12-17 DIAGNOSIS — G2581 Restless legs syndrome: Secondary | ICD-10-CM | POA: Diagnosis not present

## 2018-12-17 DIAGNOSIS — M50223 Other cervical disc displacement at C6-C7 level: Secondary | ICD-10-CM | POA: Diagnosis not present

## 2018-12-17 DIAGNOSIS — Z79899 Other long term (current) drug therapy: Secondary | ICD-10-CM | POA: Diagnosis not present

## 2018-12-17 DIAGNOSIS — G4730 Sleep apnea, unspecified: Secondary | ICD-10-CM | POA: Diagnosis not present

## 2018-12-17 DIAGNOSIS — Z79891 Long term (current) use of opiate analgesic: Secondary | ICD-10-CM | POA: Diagnosis not present

## 2018-12-17 DIAGNOSIS — M47812 Spondylosis without myelopathy or radiculopathy, cervical region: Secondary | ICD-10-CM | POA: Diagnosis not present

## 2018-12-17 HISTORY — PX: SPINAL FUSION: SHX223

## 2018-12-18 DIAGNOSIS — Z79899 Other long term (current) drug therapy: Secondary | ICD-10-CM | POA: Diagnosis not present

## 2018-12-18 DIAGNOSIS — M4722 Other spondylosis with radiculopathy, cervical region: Secondary | ICD-10-CM | POA: Diagnosis not present

## 2018-12-18 DIAGNOSIS — M50122 Cervical disc disorder at C5-C6 level with radiculopathy: Secondary | ICD-10-CM | POA: Diagnosis not present

## 2018-12-18 DIAGNOSIS — G2581 Restless legs syndrome: Secondary | ICD-10-CM | POA: Diagnosis not present

## 2018-12-18 DIAGNOSIS — G8929 Other chronic pain: Secondary | ICD-10-CM | POA: Diagnosis not present

## 2018-12-18 DIAGNOSIS — G629 Polyneuropathy, unspecified: Secondary | ICD-10-CM | POA: Diagnosis not present

## 2018-12-18 DIAGNOSIS — M4802 Spinal stenosis, cervical region: Secondary | ICD-10-CM | POA: Diagnosis not present

## 2018-12-18 DIAGNOSIS — G4730 Sleep apnea, unspecified: Secondary | ICD-10-CM | POA: Diagnosis not present

## 2018-12-18 DIAGNOSIS — Z79891 Long term (current) use of opiate analgesic: Secondary | ICD-10-CM | POA: Diagnosis not present

## 2018-12-20 DIAGNOSIS — M654 Radial styloid tenosynovitis [de Quervain]: Secondary | ICD-10-CM | POA: Diagnosis not present

## 2018-12-20 DIAGNOSIS — Z981 Arthrodesis status: Secondary | ICD-10-CM | POA: Diagnosis not present

## 2018-12-20 DIAGNOSIS — Z4789 Encounter for other orthopedic aftercare: Secondary | ICD-10-CM | POA: Diagnosis not present

## 2018-12-20 DIAGNOSIS — G2581 Restless legs syndrome: Secondary | ICD-10-CM | POA: Diagnosis not present

## 2018-12-20 DIAGNOSIS — R1310 Dysphagia, unspecified: Secondary | ICD-10-CM | POA: Diagnosis not present

## 2018-12-20 DIAGNOSIS — Z9181 History of falling: Secondary | ICD-10-CM | POA: Diagnosis not present

## 2018-12-20 DIAGNOSIS — K219 Gastro-esophageal reflux disease without esophagitis: Secondary | ICD-10-CM | POA: Diagnosis not present

## 2018-12-20 DIAGNOSIS — G4730 Sleep apnea, unspecified: Secondary | ICD-10-CM | POA: Diagnosis not present

## 2018-12-20 DIAGNOSIS — M1991 Primary osteoarthritis, unspecified site: Secondary | ICD-10-CM | POA: Diagnosis not present

## 2018-12-20 DIAGNOSIS — E079 Disorder of thyroid, unspecified: Secondary | ICD-10-CM | POA: Diagnosis not present

## 2018-12-20 DIAGNOSIS — J45909 Unspecified asthma, uncomplicated: Secondary | ICD-10-CM | POA: Diagnosis not present

## 2018-12-20 DIAGNOSIS — G8929 Other chronic pain: Secondary | ICD-10-CM | POA: Diagnosis not present

## 2018-12-20 DIAGNOSIS — Z87440 Personal history of urinary (tract) infections: Secondary | ICD-10-CM | POA: Diagnosis not present

## 2018-12-20 DIAGNOSIS — I739 Peripheral vascular disease, unspecified: Secondary | ICD-10-CM | POA: Diagnosis not present

## 2018-12-20 DIAGNOSIS — G629 Polyneuropathy, unspecified: Secondary | ICD-10-CM | POA: Diagnosis not present

## 2018-12-21 DIAGNOSIS — Z981 Arthrodesis status: Secondary | ICD-10-CM | POA: Diagnosis not present

## 2018-12-21 DIAGNOSIS — R1310 Dysphagia, unspecified: Secondary | ICD-10-CM | POA: Diagnosis not present

## 2018-12-21 DIAGNOSIS — G8929 Other chronic pain: Secondary | ICD-10-CM | POA: Diagnosis not present

## 2018-12-21 DIAGNOSIS — I739 Peripheral vascular disease, unspecified: Secondary | ICD-10-CM | POA: Diagnosis not present

## 2018-12-21 DIAGNOSIS — K219 Gastro-esophageal reflux disease without esophagitis: Secondary | ICD-10-CM | POA: Diagnosis not present

## 2018-12-21 DIAGNOSIS — M654 Radial styloid tenosynovitis [de Quervain]: Secondary | ICD-10-CM | POA: Diagnosis not present

## 2018-12-21 DIAGNOSIS — G4730 Sleep apnea, unspecified: Secondary | ICD-10-CM | POA: Diagnosis not present

## 2018-12-21 DIAGNOSIS — Z87440 Personal history of urinary (tract) infections: Secondary | ICD-10-CM | POA: Diagnosis not present

## 2018-12-21 DIAGNOSIS — Z4789 Encounter for other orthopedic aftercare: Secondary | ICD-10-CM | POA: Diagnosis not present

## 2018-12-21 DIAGNOSIS — J45909 Unspecified asthma, uncomplicated: Secondary | ICD-10-CM | POA: Diagnosis not present

## 2018-12-21 DIAGNOSIS — Z9181 History of falling: Secondary | ICD-10-CM | POA: Diagnosis not present

## 2018-12-21 DIAGNOSIS — E079 Disorder of thyroid, unspecified: Secondary | ICD-10-CM | POA: Diagnosis not present

## 2018-12-21 DIAGNOSIS — G2581 Restless legs syndrome: Secondary | ICD-10-CM | POA: Diagnosis not present

## 2018-12-21 DIAGNOSIS — G629 Polyneuropathy, unspecified: Secondary | ICD-10-CM | POA: Diagnosis not present

## 2018-12-21 DIAGNOSIS — M1991 Primary osteoarthritis, unspecified site: Secondary | ICD-10-CM | POA: Diagnosis not present

## 2018-12-25 DIAGNOSIS — G629 Polyneuropathy, unspecified: Secondary | ICD-10-CM | POA: Diagnosis not present

## 2018-12-25 DIAGNOSIS — G8929 Other chronic pain: Secondary | ICD-10-CM | POA: Diagnosis not present

## 2018-12-25 DIAGNOSIS — Z981 Arthrodesis status: Secondary | ICD-10-CM | POA: Diagnosis not present

## 2018-12-25 DIAGNOSIS — R1310 Dysphagia, unspecified: Secondary | ICD-10-CM | POA: Diagnosis not present

## 2018-12-25 DIAGNOSIS — K219 Gastro-esophageal reflux disease without esophagitis: Secondary | ICD-10-CM | POA: Diagnosis not present

## 2018-12-25 DIAGNOSIS — M654 Radial styloid tenosynovitis [de Quervain]: Secondary | ICD-10-CM | POA: Diagnosis not present

## 2018-12-25 DIAGNOSIS — M1991 Primary osteoarthritis, unspecified site: Secondary | ICD-10-CM | POA: Diagnosis not present

## 2018-12-25 DIAGNOSIS — G2581 Restless legs syndrome: Secondary | ICD-10-CM | POA: Diagnosis not present

## 2018-12-25 DIAGNOSIS — E079 Disorder of thyroid, unspecified: Secondary | ICD-10-CM | POA: Diagnosis not present

## 2018-12-25 DIAGNOSIS — Z4789 Encounter for other orthopedic aftercare: Secondary | ICD-10-CM | POA: Diagnosis not present

## 2018-12-25 DIAGNOSIS — I739 Peripheral vascular disease, unspecified: Secondary | ICD-10-CM | POA: Diagnosis not present

## 2018-12-25 DIAGNOSIS — M5412 Radiculopathy, cervical region: Secondary | ICD-10-CM | POA: Diagnosis not present

## 2018-12-25 DIAGNOSIS — Z87440 Personal history of urinary (tract) infections: Secondary | ICD-10-CM | POA: Diagnosis not present

## 2018-12-25 DIAGNOSIS — G4730 Sleep apnea, unspecified: Secondary | ICD-10-CM | POA: Diagnosis not present

## 2018-12-25 DIAGNOSIS — Z9181 History of falling: Secondary | ICD-10-CM | POA: Diagnosis not present

## 2018-12-25 DIAGNOSIS — J45909 Unspecified asthma, uncomplicated: Secondary | ICD-10-CM | POA: Diagnosis not present

## 2018-12-26 DIAGNOSIS — G4730 Sleep apnea, unspecified: Secondary | ICD-10-CM | POA: Diagnosis not present

## 2018-12-26 DIAGNOSIS — E079 Disorder of thyroid, unspecified: Secondary | ICD-10-CM | POA: Diagnosis not present

## 2018-12-26 DIAGNOSIS — G629 Polyneuropathy, unspecified: Secondary | ICD-10-CM | POA: Diagnosis not present

## 2018-12-26 DIAGNOSIS — G8929 Other chronic pain: Secondary | ICD-10-CM | POA: Diagnosis not present

## 2018-12-26 DIAGNOSIS — M654 Radial styloid tenosynovitis [de Quervain]: Secondary | ICD-10-CM | POA: Diagnosis not present

## 2018-12-26 DIAGNOSIS — I739 Peripheral vascular disease, unspecified: Secondary | ICD-10-CM | POA: Diagnosis not present

## 2018-12-26 DIAGNOSIS — K219 Gastro-esophageal reflux disease without esophagitis: Secondary | ICD-10-CM | POA: Diagnosis not present

## 2018-12-26 DIAGNOSIS — G2581 Restless legs syndrome: Secondary | ICD-10-CM | POA: Diagnosis not present

## 2018-12-26 DIAGNOSIS — Z9181 History of falling: Secondary | ICD-10-CM | POA: Diagnosis not present

## 2018-12-26 DIAGNOSIS — J45909 Unspecified asthma, uncomplicated: Secondary | ICD-10-CM | POA: Diagnosis not present

## 2018-12-26 DIAGNOSIS — R1310 Dysphagia, unspecified: Secondary | ICD-10-CM | POA: Diagnosis not present

## 2018-12-26 DIAGNOSIS — M1991 Primary osteoarthritis, unspecified site: Secondary | ICD-10-CM | POA: Diagnosis not present

## 2018-12-26 DIAGNOSIS — Z87440 Personal history of urinary (tract) infections: Secondary | ICD-10-CM | POA: Diagnosis not present

## 2018-12-26 DIAGNOSIS — Z4789 Encounter for other orthopedic aftercare: Secondary | ICD-10-CM | POA: Diagnosis not present

## 2018-12-26 DIAGNOSIS — Z981 Arthrodesis status: Secondary | ICD-10-CM | POA: Diagnosis not present

## 2018-12-31 DIAGNOSIS — M654 Radial styloid tenosynovitis [de Quervain]: Secondary | ICD-10-CM | POA: Diagnosis not present

## 2018-12-31 DIAGNOSIS — J45909 Unspecified asthma, uncomplicated: Secondary | ICD-10-CM | POA: Diagnosis not present

## 2018-12-31 DIAGNOSIS — G629 Polyneuropathy, unspecified: Secondary | ICD-10-CM | POA: Diagnosis not present

## 2018-12-31 DIAGNOSIS — E079 Disorder of thyroid, unspecified: Secondary | ICD-10-CM | POA: Diagnosis not present

## 2018-12-31 DIAGNOSIS — Z9181 History of falling: Secondary | ICD-10-CM | POA: Diagnosis not present

## 2018-12-31 DIAGNOSIS — G4730 Sleep apnea, unspecified: Secondary | ICD-10-CM | POA: Diagnosis not present

## 2018-12-31 DIAGNOSIS — I739 Peripheral vascular disease, unspecified: Secondary | ICD-10-CM | POA: Diagnosis not present

## 2018-12-31 DIAGNOSIS — Z87440 Personal history of urinary (tract) infections: Secondary | ICD-10-CM | POA: Diagnosis not present

## 2018-12-31 DIAGNOSIS — Z981 Arthrodesis status: Secondary | ICD-10-CM | POA: Diagnosis not present

## 2018-12-31 DIAGNOSIS — G2581 Restless legs syndrome: Secondary | ICD-10-CM | POA: Diagnosis not present

## 2018-12-31 DIAGNOSIS — M1991 Primary osteoarthritis, unspecified site: Secondary | ICD-10-CM | POA: Diagnosis not present

## 2018-12-31 DIAGNOSIS — K219 Gastro-esophageal reflux disease without esophagitis: Secondary | ICD-10-CM | POA: Diagnosis not present

## 2018-12-31 DIAGNOSIS — R1310 Dysphagia, unspecified: Secondary | ICD-10-CM | POA: Diagnosis not present

## 2018-12-31 DIAGNOSIS — Z4789 Encounter for other orthopedic aftercare: Secondary | ICD-10-CM | POA: Diagnosis not present

## 2018-12-31 DIAGNOSIS — G8929 Other chronic pain: Secondary | ICD-10-CM | POA: Diagnosis not present

## 2019-01-01 DIAGNOSIS — M47812 Spondylosis without myelopathy or radiculopathy, cervical region: Secondary | ICD-10-CM | POA: Diagnosis not present

## 2019-01-01 DIAGNOSIS — M50122 Cervical disc disorder at C5-C6 level with radiculopathy: Secondary | ICD-10-CM | POA: Diagnosis not present

## 2019-01-03 DIAGNOSIS — Z9181 History of falling: Secondary | ICD-10-CM | POA: Diagnosis not present

## 2019-01-03 DIAGNOSIS — M654 Radial styloid tenosynovitis [de Quervain]: Secondary | ICD-10-CM | POA: Diagnosis not present

## 2019-01-03 DIAGNOSIS — G2581 Restless legs syndrome: Secondary | ICD-10-CM | POA: Diagnosis not present

## 2019-01-03 DIAGNOSIS — M1991 Primary osteoarthritis, unspecified site: Secondary | ICD-10-CM | POA: Diagnosis not present

## 2019-01-03 DIAGNOSIS — Z87440 Personal history of urinary (tract) infections: Secondary | ICD-10-CM | POA: Diagnosis not present

## 2019-01-03 DIAGNOSIS — G8929 Other chronic pain: Secondary | ICD-10-CM | POA: Diagnosis not present

## 2019-01-03 DIAGNOSIS — G629 Polyneuropathy, unspecified: Secondary | ICD-10-CM | POA: Diagnosis not present

## 2019-01-03 DIAGNOSIS — J45909 Unspecified asthma, uncomplicated: Secondary | ICD-10-CM | POA: Diagnosis not present

## 2019-01-03 DIAGNOSIS — Z981 Arthrodesis status: Secondary | ICD-10-CM | POA: Diagnosis not present

## 2019-01-03 DIAGNOSIS — G4730 Sleep apnea, unspecified: Secondary | ICD-10-CM | POA: Diagnosis not present

## 2019-01-03 DIAGNOSIS — R1310 Dysphagia, unspecified: Secondary | ICD-10-CM | POA: Diagnosis not present

## 2019-01-03 DIAGNOSIS — I739 Peripheral vascular disease, unspecified: Secondary | ICD-10-CM | POA: Diagnosis not present

## 2019-01-03 DIAGNOSIS — K219 Gastro-esophageal reflux disease without esophagitis: Secondary | ICD-10-CM | POA: Diagnosis not present

## 2019-01-03 DIAGNOSIS — E079 Disorder of thyroid, unspecified: Secondary | ICD-10-CM | POA: Diagnosis not present

## 2019-01-03 DIAGNOSIS — Z4789 Encounter for other orthopedic aftercare: Secondary | ICD-10-CM | POA: Diagnosis not present

## 2019-01-08 DIAGNOSIS — E079 Disorder of thyroid, unspecified: Secondary | ICD-10-CM | POA: Diagnosis not present

## 2019-01-08 DIAGNOSIS — G4730 Sleep apnea, unspecified: Secondary | ICD-10-CM | POA: Diagnosis not present

## 2019-01-08 DIAGNOSIS — Z9181 History of falling: Secondary | ICD-10-CM | POA: Diagnosis not present

## 2019-01-08 DIAGNOSIS — G629 Polyneuropathy, unspecified: Secondary | ICD-10-CM | POA: Diagnosis not present

## 2019-01-08 DIAGNOSIS — M1991 Primary osteoarthritis, unspecified site: Secondary | ICD-10-CM | POA: Diagnosis not present

## 2019-01-08 DIAGNOSIS — J45909 Unspecified asthma, uncomplicated: Secondary | ICD-10-CM | POA: Diagnosis not present

## 2019-01-08 DIAGNOSIS — K219 Gastro-esophageal reflux disease without esophagitis: Secondary | ICD-10-CM | POA: Diagnosis not present

## 2019-01-08 DIAGNOSIS — Z87440 Personal history of urinary (tract) infections: Secondary | ICD-10-CM | POA: Diagnosis not present

## 2019-01-08 DIAGNOSIS — I739 Peripheral vascular disease, unspecified: Secondary | ICD-10-CM | POA: Diagnosis not present

## 2019-01-08 DIAGNOSIS — M654 Radial styloid tenosynovitis [de Quervain]: Secondary | ICD-10-CM | POA: Diagnosis not present

## 2019-01-08 DIAGNOSIS — G8929 Other chronic pain: Secondary | ICD-10-CM | POA: Diagnosis not present

## 2019-01-08 DIAGNOSIS — R1310 Dysphagia, unspecified: Secondary | ICD-10-CM | POA: Diagnosis not present

## 2019-01-08 DIAGNOSIS — Z981 Arthrodesis status: Secondary | ICD-10-CM | POA: Diagnosis not present

## 2019-01-08 DIAGNOSIS — G2581 Restless legs syndrome: Secondary | ICD-10-CM | POA: Diagnosis not present

## 2019-01-08 DIAGNOSIS — Z4789 Encounter for other orthopedic aftercare: Secondary | ICD-10-CM | POA: Diagnosis not present

## 2019-02-05 DIAGNOSIS — Z981 Arthrodesis status: Secondary | ICD-10-CM | POA: Diagnosis not present

## 2019-02-05 DIAGNOSIS — Z4789 Encounter for other orthopedic aftercare: Secondary | ICD-10-CM | POA: Diagnosis not present

## 2019-02-05 DIAGNOSIS — G2581 Restless legs syndrome: Secondary | ICD-10-CM | POA: Diagnosis not present

## 2019-02-05 DIAGNOSIS — M654 Radial styloid tenosynovitis [de Quervain]: Secondary | ICD-10-CM | POA: Diagnosis not present

## 2019-02-05 DIAGNOSIS — M1991 Primary osteoarthritis, unspecified site: Secondary | ICD-10-CM | POA: Diagnosis not present

## 2019-02-05 DIAGNOSIS — G4730 Sleep apnea, unspecified: Secondary | ICD-10-CM | POA: Diagnosis not present

## 2019-02-05 DIAGNOSIS — R1310 Dysphagia, unspecified: Secondary | ICD-10-CM | POA: Diagnosis not present

## 2019-02-05 DIAGNOSIS — G8929 Other chronic pain: Secondary | ICD-10-CM | POA: Diagnosis not present

## 2019-02-05 DIAGNOSIS — Z87440 Personal history of urinary (tract) infections: Secondary | ICD-10-CM | POA: Diagnosis not present

## 2019-02-05 DIAGNOSIS — Z9181 History of falling: Secondary | ICD-10-CM | POA: Diagnosis not present

## 2019-02-05 DIAGNOSIS — E079 Disorder of thyroid, unspecified: Secondary | ICD-10-CM | POA: Diagnosis not present

## 2019-02-05 DIAGNOSIS — K219 Gastro-esophageal reflux disease without esophagitis: Secondary | ICD-10-CM | POA: Diagnosis not present

## 2019-02-05 DIAGNOSIS — G629 Polyneuropathy, unspecified: Secondary | ICD-10-CM | POA: Diagnosis not present

## 2019-02-05 DIAGNOSIS — I739 Peripheral vascular disease, unspecified: Secondary | ICD-10-CM | POA: Diagnosis not present

## 2019-02-05 DIAGNOSIS — J45909 Unspecified asthma, uncomplicated: Secondary | ICD-10-CM | POA: Diagnosis not present

## 2019-02-08 DIAGNOSIS — M542 Cervicalgia: Secondary | ICD-10-CM

## 2019-02-08 DIAGNOSIS — M1990 Unspecified osteoarthritis, unspecified site: Secondary | ICD-10-CM | POA: Diagnosis not present

## 2019-02-08 DIAGNOSIS — G8929 Other chronic pain: Secondary | ICD-10-CM

## 2019-02-08 DIAGNOSIS — Z0289 Encounter for other administrative examinations: Secondary | ICD-10-CM

## 2019-02-08 DIAGNOSIS — M25511 Pain in right shoulder: Secondary | ICD-10-CM | POA: Diagnosis not present

## 2019-02-08 DIAGNOSIS — M25512 Pain in left shoulder: Secondary | ICD-10-CM | POA: Diagnosis not present

## 2019-02-08 HISTORY — DX: Other chronic pain: G89.29

## 2019-02-08 HISTORY — DX: Encounter for other administrative examinations: Z02.89

## 2019-02-08 HISTORY — DX: Cervicalgia: M54.2

## 2019-02-12 DIAGNOSIS — M5412 Radiculopathy, cervical region: Secondary | ICD-10-CM | POA: Diagnosis not present

## 2019-03-29 DIAGNOSIS — D3131 Benign neoplasm of right choroid: Secondary | ICD-10-CM | POA: Diagnosis not present

## 2019-04-17 DIAGNOSIS — N6311 Unspecified lump in the right breast, upper outer quadrant: Secondary | ICD-10-CM | POA: Diagnosis not present

## 2019-04-17 DIAGNOSIS — R1030 Lower abdominal pain, unspecified: Secondary | ICD-10-CM | POA: Diagnosis not present

## 2019-04-17 DIAGNOSIS — N6310 Unspecified lump in the right breast, unspecified quadrant: Secondary | ICD-10-CM | POA: Diagnosis not present

## 2019-04-17 DIAGNOSIS — K210 Gastro-esophageal reflux disease with esophagitis: Secondary | ICD-10-CM | POA: Diagnosis not present

## 2019-04-17 DIAGNOSIS — N63 Unspecified lump in unspecified breast: Secondary | ICD-10-CM | POA: Diagnosis not present

## 2019-04-22 DIAGNOSIS — N6312 Unspecified lump in the right breast, upper inner quadrant: Secondary | ICD-10-CM | POA: Diagnosis not present

## 2019-04-22 DIAGNOSIS — N6459 Other signs and symptoms in breast: Secondary | ICD-10-CM | POA: Diagnosis not present

## 2019-04-22 DIAGNOSIS — R928 Other abnormal and inconclusive findings on diagnostic imaging of breast: Secondary | ICD-10-CM | POA: Diagnosis not present

## 2019-04-23 ENCOUNTER — Encounter: Payer: Self-pay | Admitting: Gastroenterology

## 2019-04-24 ENCOUNTER — Telehealth (INDEPENDENT_AMBULATORY_CARE_PROVIDER_SITE_OTHER): Payer: Medicare Other | Admitting: Gastroenterology

## 2019-04-24 ENCOUNTER — Encounter: Payer: Self-pay | Admitting: Gastroenterology

## 2019-04-24 ENCOUNTER — Other Ambulatory Visit: Payer: Self-pay

## 2019-04-24 VITALS — Ht 64.0 in | Wt 168.0 lb

## 2019-04-24 DIAGNOSIS — Z1211 Encounter for screening for malignant neoplasm of colon: Secondary | ICD-10-CM

## 2019-04-24 DIAGNOSIS — K219 Gastro-esophageal reflux disease without esophagitis: Secondary | ICD-10-CM | POA: Diagnosis not present

## 2019-04-24 DIAGNOSIS — R109 Unspecified abdominal pain: Secondary | ICD-10-CM

## 2019-04-24 MED ORDER — SUPREP BOWEL PREP KIT 17.5-3.13-1.6 GM/177ML PO SOLN: 1.0000 | ORAL | 0 refills | Status: DC

## 2019-04-24 NOTE — Patient Instructions (Addendum)
If you are age 56 or older, your body mass index should be between 23-30. Your Body mass index is 28.84 kg/m. If this is out of the aforementioned range listed, please consider follow up with your Primary Care Provider.  If you are age 71 or younger, your body mass index should be between 19-25. Your Body mass index is 28.84 kg/m. If this is out of the aformentioned range listed, please consider follow up with your Primary Care Provider.   We have sent the following medications to your pharmacy for you to pick up at your convenience: [facility] have been scheduled for an endoscopy and colonoscopy. Please follow the written instructions given to you at your visit today. Please pick up your prep supplies at the pharmacy within the next 1-3 days. If you use inhalers (even only as needed), please bring them with you on the day of your procedure. Your physician has requested that you go to www.startemmi.com and enter the access code given to you at your visit today. This web site gives a general overview about your procedure. However, you should still follow specific instructions given to you by our office regarding your preparation for the procedure.  To help prevent the possible spread of infection to our patients, communities, and staff; we will be implementing the following measures:  As of now we are not allowing any visitors/family members to accompany you to any upcoming appointments with [facility]. If you have any concerns about this please contact our office to discuss prior to the appointment.   Thank you,  Dr. Jackquline Denmark

## 2019-04-24 NOTE — Progress Notes (Signed)
Chief Complaint: Abdominal pain  Referring Provider: Dr. Sheran Fava      ASSESSMENT AND PLAN;   #1.  Left lower quadrant abdominal pain.  Has been treated previously empirically with antibiotics for presumed diverticulitis. #2.  Colorectal cancer screening #3.  Gastroesophageal reflux #4.  History of gastric ulcers. #5.  IBS with alternating diarrhea and constipation.  Plan: -Nexium 40 mg p.o. once a day to continue. -Proceed with EGD/colon. Discussed risks & benefits. (Risks including rare perforation req laparotomy, bleeding after biopsies/polypectomy req blood transfusion, rare chance of missing neoplasms, risks of anesthesia/sedation). Benefits outweigh the risks. Patient agrees to proceed. All the questions were answered. Consent forms given for review. -I have instructed patient to stop smoking.  Have discussed risks associated with smoking including risks of various cancers. -If still with problems and the above WU is negative, proceed with CT scan abdo/pelvis.      HPI:    Nancy Cervantes is a 58 y.o. female  LLq pain x 1 month, moderate, nonradiating, intermittent, does get some relief with defecation. Seen Dr. Burnett Sheng, started on Cipro 500 mg p.o. twice daily for 7 days for presumed diverticulitis. Still with abdominal discomfort She has been advised to get a colonoscopy performed. No fever or chills Does complain of abdominal bloating with alternating diarrhea and constipation.  Had more constipation previously.  Would pass pellet-like stools.  When she has diarrhea she has softer bowel movements at the frequency of 1 to 2/day after eating.  No melena or hematochezia.  Also has been having reflux despite Nexium.  Has a history of peptic ulcer disease.  Denies use of nonsteroidals.  No significant weight loss.  Denies having any dysphagia.  Continues to smoke despite medical advice.  Review of past records: Limited records available at the present time  -Colonoscopy 02/2004 ([facility])-small colonic polyp status post polypectomy, moderate sigmoid diverticulosis. -EGD 05/2001 H. pylori gastric ulcers.  EGD 08/2012 Dr. Bettina Gavia hiatal hernia, deformed antrum due to previous peptic ulcer disease, small antral ulcer.  Biopsies negative for HP Past Medical History:  Diagnosis Date  . Depression   . Osteoarthritis   . Restless leg syndrome   . Restless leg syndrome     Past Surgical History:  Procedure Laterality Date  . bilateral tubal ligation    . CHOLECYSTECTOMY    . EGD  08/28/2012   Small active ulcer. Deformed antrum from prior ulcer. Hiatal hernia.   . Right and Left foot surgeries      Family History  Problem Relation Age of Onset  . Colon cancer Cousin   . Colon cancer Maternal Uncle     Social History   Tobacco Use  . Smoking status: Current Every Day Smoker  . Smokeless tobacco: Never Used  Substance Use Topics  . Alcohol use: Never    Frequency: Never  . Drug use: Never    Current Outpatient Medications  Medication Sig Dispense Refill  . esomeprazole (NEXIUM) 40 MG capsule Take 40 mg by mouth daily.   3  . fluconazole (DIFLUCAN) 150 MG tablet Take 150 mg by mouth daily.    Marland Kitchen HYDROcodone-acetaminophen (NORCO/VICODIN) 5-325 MG tablet Take 1 tablet by mouth every 6 (six) hours as needed for moderate pain.     No current facility-administered medications for this visit.     Allergies  Allergen Reactions  . Gabapentin Itching and Nausea And Vomiting  . Latex Rash  . Morphine And Related Palpitations  . Neomycin-Bacitracin Zn-Polymyx Rash  .  Pregabalin Nausea And Vomiting  . Topiramate Rash    Review of Systems:  Constitutional: Denies fever, chills, diaphoresis, appetite change and fatigue.  HEENT: Denies photophobia, eye pain, redness, hearing loss, ear pain, congestion, sore throat, rhinorrhea, sneezing, mouth sores, neck pain, neck stiffness and tinnitus.   Respiratory: Denies SOB, DOE, cough, chest  tightness,  and wheezing.  Not on home oxygen. Cardiovascular: Denies chest pain, palpitations and leg swelling.  Genitourinary: Denies dysuria, urgency, frequency, hematuria, flank pain and difficulty urinating.  Musculoskeletal: has myalgias, back pain, joint swelling, arthralgias and gait problem.  Is on disability. Skin: No rash.  Neurological: Denies dizziness, seizures, syncope, weakness, light-headedness, numbness and headaches.  Hematological: Denies adenopathy. Easy bruising, personal or family bleeding history  Psychiatric/Behavioral: has anxiety or depression     Physical Exam:    Ht 5\' 4"  (1.626 m)   Wt 168 lb (76.2 kg)   BMI 28.84 kg/m  Filed Weights   04/24/19 0839  Weight: 168 lb (76.2 kg)   Constitutional:  Well-developed, in no acute distress. Psychiatric: Normal mood and affect. Behavior is normal. HEENT: Pupils normal.  Conjunctivae are normal. No scleral icterus. Neck supple.  Cardiovascular: Normal rate, regular rhythm. No edema Pulmonary/chest: Effort normal and breath sounds normal. No wheezing, rales or rhonchi. Abdominal: Soft, nondistended. Nontender. Bowel sounds active throughout. There are no masses palpable. No hepatomegaly. Rectal:  defered Neurological: Alert and oriented to person place and time. Skin: Skin is warm and dry. No rashes noted.  Data Reviewed: I have personally reviewed following labs and imaging studies  CBC: CBC Latest Ref Rng & Units 07/23/2018 07/23/2018 04/15/2009  WBC 4.0 - 10.5 K/uL - 7.3 6.3  Hemoglobin 12.0 - 15.0 g/dL 13.3 13.8 10.2(L)  Hematocrit 36.0 - 46.0 % 39.0 41.2 31.4(L)  Platelets 150 - 400 K/uL - 232 360    CMP: CMP Latest Ref Rng & Units 07/23/2018 07/23/2018 04/15/2009  Glucose 70 - 99 mg/dL 125(H) 126(H) 82  BUN 6 - 20 mg/dL 14 13 4(L)  Creatinine 0.44 - 1.00 mg/dL 0.70 0.87 0.74  Sodium 135 - 145 mmol/L 143 143 140  Potassium 3.5 - 5.1 mmol/L 3.7 3.8 3.8  Chloride 98 - 111 mmol/L 109 108 109  CO2 22 - 32  mmol/L - 23 26  Calcium 8.9 - 10.3 mg/dL - 8.8(L) 8.7  Total Protein 6.5 - 8.1 g/dL - 6.3(L) 5.9(L)  Total Bilirubin 0.3 - 1.2 mg/dL - 0.4 0.3  Alkaline Phos 38 - 126 U/L - 92 79  AST 15 - 41 U/L - 21 19  ALT 0 - 44 U/L - 18 15  Labs reviewed from 04/18/2019 -Normal CBC with hemoglobin 14.4, WBC count 8.3, platelets 217 -Normal CMP sodium 143, BUN 13, creatinine 0.78, normal liver function tests    This service was provided via telemedicine.  The patient was located at home.  The provider was located in office.  The patient did consent to this telephone visit and is aware of possible charges through their insurance for this visit.  The patient was referred by Dr. Burnett Sheng.   Time spent on call/coordination of care: 45 min    Carmell Austria, MD 04/24/2019, 9:44 AM  Cc: Dr. Sheran Fava

## 2019-04-30 DIAGNOSIS — Z1159 Encounter for screening for other viral diseases: Secondary | ICD-10-CM | POA: Diagnosis not present

## 2019-04-30 DIAGNOSIS — B349 Viral infection, unspecified: Secondary | ICD-10-CM | POA: Diagnosis not present

## 2019-05-08 DIAGNOSIS — M5441 Lumbago with sciatica, right side: Secondary | ICD-10-CM | POA: Diagnosis not present

## 2019-05-08 DIAGNOSIS — M5442 Lumbago with sciatica, left side: Secondary | ICD-10-CM | POA: Diagnosis not present

## 2019-05-08 DIAGNOSIS — M542 Cervicalgia: Secondary | ICD-10-CM | POA: Diagnosis not present

## 2019-05-08 DIAGNOSIS — G8929 Other chronic pain: Secondary | ICD-10-CM | POA: Diagnosis not present

## 2019-05-08 DIAGNOSIS — Z79891 Long term (current) use of opiate analgesic: Secondary | ICD-10-CM | POA: Diagnosis not present

## 2019-05-15 ENCOUNTER — Telehealth: Payer: Self-pay | Admitting: Gastroenterology

## 2019-05-15 NOTE — Telephone Encounter (Signed)
Called all numbers listed in patient's chart and no answer and no vmail setup. Covid-19 Screening Questions:  Do you now or have you had a fever in the last 14 days?   Do you have any respiratory symptoms of shortness of breath or cough now or in the last 14 days?   Do you have any family members or close contacts with diagnosed or suspected Covid-19 in the past 14 days?   Have you been tested for Covid-19 and found to be positive?

## 2019-05-16 ENCOUNTER — Encounter: Payer: Self-pay | Admitting: Gastroenterology

## 2019-05-16 ENCOUNTER — Ambulatory Visit (AMBULATORY_SURGERY_CENTER): Payer: Medicare Other | Admitting: Gastroenterology

## 2019-05-16 ENCOUNTER — Other Ambulatory Visit: Payer: Self-pay

## 2019-05-16 VITALS — BP 137/90 | HR 63 | Temp 97.6°F | Resp 14 | Wt 168.0 lb

## 2019-05-16 DIAGNOSIS — Z1211 Encounter for screening for malignant neoplasm of colon: Secondary | ICD-10-CM | POA: Diagnosis not present

## 2019-05-16 DIAGNOSIS — K648 Other hemorrhoids: Secondary | ICD-10-CM | POA: Diagnosis not present

## 2019-05-16 DIAGNOSIS — G2581 Restless legs syndrome: Secondary | ICD-10-CM | POA: Diagnosis not present

## 2019-05-16 DIAGNOSIS — K219 Gastro-esophageal reflux disease without esophagitis: Secondary | ICD-10-CM

## 2019-05-16 DIAGNOSIS — D124 Benign neoplasm of descending colon: Secondary | ICD-10-CM

## 2019-05-16 DIAGNOSIS — K5730 Diverticulosis of large intestine without perforation or abscess without bleeding: Secondary | ICD-10-CM | POA: Diagnosis not present

## 2019-05-16 DIAGNOSIS — K259 Gastric ulcer, unspecified as acute or chronic, without hemorrhage or perforation: Secondary | ICD-10-CM | POA: Diagnosis not present

## 2019-05-16 DIAGNOSIS — K2950 Unspecified chronic gastritis without bleeding: Secondary | ICD-10-CM | POA: Diagnosis not present

## 2019-05-16 DIAGNOSIS — K2970 Gastritis, unspecified, without bleeding: Secondary | ICD-10-CM | POA: Diagnosis not present

## 2019-05-16 DIAGNOSIS — B3781 Candidal esophagitis: Secondary | ICD-10-CM | POA: Diagnosis not present

## 2019-05-16 DIAGNOSIS — D126 Benign neoplasm of colon, unspecified: Secondary | ICD-10-CM | POA: Diagnosis not present

## 2019-05-16 DIAGNOSIS — K635 Polyp of colon: Secondary | ICD-10-CM

## 2019-05-16 DIAGNOSIS — K2900 Acute gastritis without bleeding: Secondary | ICD-10-CM | POA: Diagnosis not present

## 2019-05-16 DIAGNOSIS — D125 Benign neoplasm of sigmoid colon: Secondary | ICD-10-CM

## 2019-05-16 MED ORDER — FLUCONAZOLE 100 MG PO TABS: 100.0000 mg | ORAL_TABLET | Freq: Every day | ORAL | 0 refills | Status: DC

## 2019-05-16 MED ORDER — SODIUM CHLORIDE 0.9 % IV SOLN: 500.0000 mL | Freq: Once | INTRAVENOUS | Status: AC

## 2019-05-16 MED ORDER — PANTOPRAZOLE SODIUM 40 MG PO TBEC: 40.0000 mg | DELAYED_RELEASE_TABLET | Freq: Two times a day (BID) | ORAL | 4 refills | Status: DC

## 2019-05-16 NOTE — Progress Notes (Signed)
Report to PACU, RN, vss, BBS= Clear.  

## 2019-05-16 NOTE — Patient Instructions (Signed)
YOU HAD AN ENDOSCOPIC PROCEDURE TODAY AT [facility]:   Refer to the procedure report that was given to you for any specific questions about what was found during the examination.  If the procedure report does not answer your questions, please call your gastroenterologist to clarify.  If you requested that your care partner not be given the details of your procedure findings, then the procedure report has been included in a sealed envelope for you to review at your convenience later.  YOU SHOULD EXPECT: Some feelings of bloating in the abdomen. Passage of more gas than usual.  Walking can help get rid of the air that was put into your GI tract during the procedure and reduce the bloating. If you had a lower endoscopy (such as a colonoscopy or flexible sigmoidoscopy) you may notice spotting of blood in your stool or on the toilet paper. If you underwent a bowel prep for your procedure, you may not have a normal bowel movement for a few days.  Please Note:  You might notice some irritation and congestion in your nose or some drainage.  This is from the oxygen used during your procedure.  There is no need for concern and it should clear up in a day or so.  SYMPTOMS TO REPORT IMMEDIATELY:   Following lower endoscopy (colonoscopy or flexible sigmoidoscopy):  Excessive amounts of blood in the stool  Significant tenderness or worsening of abdominal pains  Swelling of the abdomen that is new, acute  Fever of 100F or higher   Following upper endoscopy (EGD)  Vomiting of blood or coffee ground material  New chest pain or pain under the shoulder blades  Painful or persistently difficult swallowing  New shortness of breath  Fever of 100F or higher  Black, tarry-looking stools  For urgent or emergent issues, a gastroenterologist can be reached at any hour by calling [phone].   DIET:  We do recommend a small meal at first, but then you may proceed to your regular diet.  Drink  plenty of fluids but you should avoid alcoholic beverages for 24 hours.  ACTIVITY:  You should plan to take it easy for the rest of today and you should NOT DRIVE or use heavy machinery until tomorrow (because of the sedation medicines used during the test).    FOLLOW UP: Our staff will call the number listed on your records 48-72 hours following your procedure to check on you and address any questions or concerns that you may have regarding the information given to you following your procedure. If we do not reach you, we will leave a message.  We will attempt to reach you two times.  During this call, we will ask if you have developed any symptoms of COVID 19. If you develop any symptoms (ie: fever, flu-like symptoms, shortness of breath, cough etc.) before then, please call [phone].  If you test positive for Covid 19 in the 2 weeks post procedure, please call and report this information to [place].    If any biopsies were taken you will be contacted by phone or by letter within the next 1-3 weeks.  Please call us at [phone] if you have not heard about the biopsies in 3 weeks.   Await for biopsy results Polyps (handout given) Diverticulosis (handout given) Repeat EGD in 12 weeks to ensure healing of the ulcers Change Nexium to Protonix 40 mg twice a day  Dilflucan 100 mg every day for 14 days  SIGNATURES/CONFIDENTIALITY:  You and/or your care partner have signed paperwork which will be entered into your electronic medical record.  These signatures attest to the fact that that the information above on your After Visit Summary has been reviewed and is understood.  Full responsibility of the confidentiality of this discharge information lies with you and/or your care-partner.

## 2019-05-16 NOTE — Op Note (Signed)
[facility] Patient Name: Nancy Cervantes Procedure Date: 05/16/2019 3:01 PM MRN: [id number] Endoscopist: Jackquline Denmark , MD Age: 58 Referring MD:  Date of Birth: Feb 12, 1961 Gender: Female Account #: [id number] Procedure:                Colonoscopy Indications:              Generalized abdominal pain. Medicines:                Monitored Anesthesia Care Procedure:                Pre-Anesthesia Assessment:                           - Prior to the procedure, a History and Physical                            was performed, and patient medications and                            allergies were reviewed. The patient's tolerance of                            previous anesthesia was also reviewed. The risks                            and benefits of the procedure and the sedation                            options and risks were discussed with the patient.                            All questions were answered, and informed consent                            was obtained. Prior Anticoagulants: The patient has                            taken no previous anticoagulant or antiplatelet                            agents. ASA Grade Assessment: II - A patient with                            mild systemic disease. After reviewing the risks                            and benefits, the patient was deemed in                            satisfactory condition to undergo the procedure.                           After obtaining informed consent, the colonoscope  was passed under direct vision. Throughout the                            procedure, the patient's blood pressure, pulse, and                            oxygen saturations were monitored continuously. The                            Colonoscope was introduced through the anus and                            advanced to the the cecum, identified by                            appendiceal orifice and ileocecal valve. The                         colonoscopy was performed without difficulty. The                            patient tolerated the procedure well. The quality                            of the bowel preparation was adequate to identify                            polyps. The ileocecal valve, appendiceal orifice,                            and rectum were photographed. Scope In: 3:14:15 PM Scope Out: 3:34:05 PM Scope Withdrawal Time: 0 hours 14 minutes 26 seconds  Total Procedure Duration: 0 hours 19 minutes 50 seconds  Findings:                 Two 4 to 6 mm sessile polyps were found in the                            distal sigmoid colon. Removed with a cold snare.                            Resection and retrieval were complete. Estimated                            blood loss: none.                           Multiple medium-mouthed diverticula were found in                            the sigmoid colon and descending colon.                           Colonic mucosa was normal with well preserved  vascular pattern. Biopsies for histology were taken                            with a cold forceps for evaluation of microscopic                            colitis.                           Non-bleeding internal hemorrhoids were found during                            retroflexion. The hemorrhoids were small. Complications:            No immediate complications. Estimated Blood Loss:     Estimated blood loss: none. Impression:               -Diminutive colonic polyps status post polypectomy.                           -Moderate left colonic diverticulosis.                           -Non-bleeding internal hemorrhoids. Recommendation:           - Patient has a contact number available for                            emergencies. The signs and symptoms of potential                            delayed complications were discussed with the                            patient. Return to  normal activities tomorrow.                            Written discharge instructions were provided to the                            patient.                           - High fiber diet.                           - Continue present medications.                           - Await pathology results.                           - Repeat colonoscopy for surveillance based on                            pathology results.                           -  Return to GI clinic in 6 weeks. If still with                            abdominal pain, CT scan Abdo/pelvis. Jackquline Denmark, MD 05/16/2019 3:57:37 PM This report has been signed electronically.

## 2019-05-16 NOTE — Progress Notes (Signed)
Called to room to assist during endoscopic procedure.  Patient ID and intended procedure confirmed with present staff. Received instructions for my participation in the procedure from the performing physician.  

## 2019-05-16 NOTE — Progress Notes (Signed)
[facility] temp JB vitals

## 2019-05-16 NOTE — Op Note (Signed)
[facility] Patient Name: Nancy Cervantes Procedure Date: 05/16/2019 3:01 PM MRN: [id number] Endoscopist: Jackquline Denmark , MD Age: 58 Referring MD:  Date of Birth: 1961/06/27 Gender: Female Account #: [id number] Procedure:                Upper GI endoscopy Indications:              Epigastric abdominal pain, Heartburn Medicines:                Monitored Anesthesia Care Procedure:                Pre-Anesthesia Assessment:                           - Prior to the procedure, a History and Physical                            was performed, and patient medications and                            allergies were reviewed. The patient's tolerance of                            previous anesthesia was also reviewed. The risks                            and benefits of the procedure and the sedation                            options and risks were discussed with the patient.                            All questions were answered, and informed consent                            was obtained. Prior Anticoagulants: The patient has                            taken no previous anticoagulant or antiplatelet                            agents. ASA Grade Assessment: II - A patient with                            mild systemic disease. After reviewing the risks                            and benefits, the patient was deemed in                            satisfactory condition to undergo the procedure.                           After obtaining informed consent, the endoscope was  passed under direct vision. Throughout the                            procedure, the patient's blood pressure, pulse, and                            oxygen saturations were monitored continuously. The                            Endoscope was introduced through the mouth, and                            advanced to the second part of duodenum. The upper                            GI endoscopy was  accomplished without difficulty.                            The patient tolerated the procedure well. Scope In: Scope Out: Findings:                 Esophagitis with no bleeding was found 34 to 36 cm                            from the incisors. Biopsies were taken with a cold                            forceps for histology. 3-4 small whitish lesions                            was also noted in the proximal and mid esophagus.                           One non-bleeding linear gastric ulcer with no                            stigmata of bleeding was found at the incisura. The                            lesion was 10 mm in largest dimension. Biopsies                            were taken with a cold forceps for histology. Few                            erosions were noted in the antrum with surrounding                            gastritis. Pylorus was somewhat deformed.                           The examined duodenum was normal. Complications:            No immediate complications.  Estimated Blood Loss:     Estimated blood loss: none. Impression:               - Esophagitis. Biopsied.                           - Mild Candida esophagitis.                           - Gastric ulcer with surrounding erosive gastritis. Recommendation:           - Patient has a contact number available for                            emergencies. The signs and symptoms of potential                            delayed complications were discussed with the                            patient. Return to normal activities tomorrow.                            Written discharge instructions were provided to the                            patient.                           - Resume previous diet.                           - Change Nexium to Protonix 40 mg p.o. twice daily,                            60 with 4 refills.                           - Diflucan 100 mg p.o. qd x 14 days.                           - Await  pathology results.                           - No aspirin, ibuprofen, naproxen, or other                            non-steroidal anti-inflammatory drugs.                           - Rpt EGD in 12 weeks to ensure healing of the                            ulcer. Jackquline Denmark, MD 05/16/2019 3:52:17 PM This report has been signed electronically.

## 2019-05-21 ENCOUNTER — Telehealth: Payer: Self-pay

## 2019-05-21 NOTE — Telephone Encounter (Signed)
Follow up call x 2, left a voicemail.

## 2019-05-21 NOTE — Telephone Encounter (Signed)
Follow up call made, left a voicemail. 

## 2019-05-26 ENCOUNTER — Encounter: Payer: Self-pay | Admitting: Gastroenterology

## 2019-06-03 DIAGNOSIS — M25511 Pain in right shoulder: Secondary | ICD-10-CM | POA: Diagnosis not present

## 2019-06-03 DIAGNOSIS — M5442 Lumbago with sciatica, left side: Secondary | ICD-10-CM | POA: Diagnosis not present

## 2019-06-03 DIAGNOSIS — Z981 Arthrodesis status: Secondary | ICD-10-CM

## 2019-06-03 DIAGNOSIS — M542 Cervicalgia: Secondary | ICD-10-CM | POA: Diagnosis not present

## 2019-06-03 DIAGNOSIS — K2970 Gastritis, unspecified, without bleeding: Secondary | ICD-10-CM

## 2019-06-03 DIAGNOSIS — M5441 Lumbago with sciatica, right side: Secondary | ICD-10-CM | POA: Diagnosis not present

## 2019-06-03 DIAGNOSIS — Z79891 Long term (current) use of opiate analgesic: Secondary | ICD-10-CM | POA: Diagnosis not present

## 2019-06-03 DIAGNOSIS — M25512 Pain in left shoulder: Secondary | ICD-10-CM | POA: Diagnosis not present

## 2019-06-03 HISTORY — DX: Gastritis, unspecified, without bleeding: K29.70

## 2019-06-03 HISTORY — DX: Arthrodesis status: Z98.1

## 2019-06-06 DIAGNOSIS — M5412 Radiculopathy, cervical region: Secondary | ICD-10-CM | POA: Diagnosis not present

## 2019-06-07 DIAGNOSIS — M79601 Pain in right arm: Secondary | ICD-10-CM | POA: Diagnosis not present

## 2019-06-14 ENCOUNTER — Telehealth: Payer: Self-pay | Admitting: Gastroenterology

## 2019-06-14 DIAGNOSIS — K219 Gastro-esophageal reflux disease without esophagitis: Secondary | ICD-10-CM

## 2019-06-14 NOTE — Telephone Encounter (Signed)
Patient said that after her endo/colon she has been feeling bloated and has been about to keep her food down. She gets sick every time she eats. She also said that the protonix does not seem to help.

## 2019-06-14 NOTE — Telephone Encounter (Signed)
Left message for patient to call back  

## 2019-06-20 NOTE — Telephone Encounter (Signed)
Called and spoke with patient-patient reports she is not having as much nausea/vomiting and the bloating has gotten a little better; patient reports she has been limiting/cutting out the foods that are upsetting her stomach; patient reports she is not longer eating eggs/ribs-still having indigestion at night (waking her up to have to go and vomit); patient feels like the Protonix is no longer helping like the Nexium; decreased the amount of sodas; throat is still burning-does not know if her stomach is causing irritation in her throat;  Patient reports she cannot use Tums-right sided abd pain-patient thinks she has an abdominal ulcer on the right side-when laying down on right side-has pain and tenderness Please advise of alternative measures as patient is wanting to know what "Dr. Lyndel Safe tells me to take-only taking what he says I need to use";

## 2019-06-20 NOTE — Telephone Encounter (Signed)
No problems Had ulcers  Plan: -Whichever works better -she should take Nexium 40 mg p.o. once a day or Protonix 40 mg p.o. once a day. -Add Carafate 1 g p.o. twice daily.  Take at least 2 hours before or after rest of the medications.x 2 weeks. -Please make sure she is scheduled for repeat EGD, 12 weeks from the previous EGD. -She needs to call us in 2 weeks to let us know how she is doing.  If she still has problems, will give her a trial of GI cocktail. -Avoid all nonsteroidals.  RG

## 2019-06-21 MED ORDER — SUCRALFATE 1 GM/10ML PO SUSP: 1.0000 g | Freq: Two times a day (BID) | ORAL | 0 refills | Status: AC

## 2019-06-21 MED ORDER — ESOMEPRAZOLE MAGNESIUM 40 MG PO CPDR: 40.0000 mg | DELAYED_RELEASE_CAPSULE | Freq: Two times a day (BID) | ORAL | 11 refills | Status: DC

## 2019-06-21 NOTE — Telephone Encounter (Signed)
RX sent to pharmacy per MD recommendations; patient is already aware to pick up RX's;

## 2019-06-21 NOTE — Telephone Encounter (Signed)
Called and spoke with patient-patient informed of MD recommendations; patient is agreeable with plan of care; Patient verbalized understanding of information/instructions; Patient was advised to call the office at [phone] if questions/concerns arise;  Patient has requested to take Nexium (generic)- Patient has been scheduled for a f/u OV on 06/26/2019 at 10:40 and will need to be scheduled for EGD and have instructions given at this appt;  For order processing= Please clarify order for Nexium=#/ refills? Also Carafate clarification needed- Patient requested RX be sent to pharmacy listed on chart;

## 2019-06-21 NOTE — Telephone Encounter (Signed)
Nexium 40 mg p.o. BID #60, 11 refills Carafate 1 g p.o. twice daily for 2 weeks (#28).  No refills.  Thx  RG

## 2019-06-25 ENCOUNTER — Telehealth: Payer: Self-pay

## 2019-06-25 NOTE — Telephone Encounter (Signed)
Covid-19 screening questions   Do you now or have you had a fever in the last 14 days? No  Do you have any respiratory symptoms of shortness of breath or cough now or in the last 14 days? No  Do you have any family members or close contacts with diagnosed or suspected Covid-19 in the past 14 days? No  Have you been tested for Covid-19 and found to be positive? Been tested May or June and found to be negative

## 2019-06-25 NOTE — Telephone Encounter (Signed)
Called patient and left voicemail to give me a call back

## 2019-06-26 ENCOUNTER — Other Ambulatory Visit: Payer: Self-pay

## 2019-06-26 ENCOUNTER — Other Ambulatory Visit (INDEPENDENT_AMBULATORY_CARE_PROVIDER_SITE_OTHER): Payer: Medicare Other

## 2019-06-26 ENCOUNTER — Ambulatory Visit (INDEPENDENT_AMBULATORY_CARE_PROVIDER_SITE_OTHER): Payer: Medicare Other | Admitting: Gastroenterology

## 2019-06-26 VITALS — BP 128/84 | HR 87 | Temp 98.1°F | Ht 64.0 in | Wt 171.2 lb

## 2019-06-26 DIAGNOSIS — K582 Mixed irritable bowel syndrome: Secondary | ICD-10-CM

## 2019-06-26 DIAGNOSIS — G8929 Other chronic pain: Secondary | ICD-10-CM

## 2019-06-26 DIAGNOSIS — Z8719 Personal history of other diseases of the digestive system: Secondary | ICD-10-CM | POA: Diagnosis not present

## 2019-06-26 DIAGNOSIS — Z1211 Encounter for screening for malignant neoplasm of colon: Secondary | ICD-10-CM | POA: Diagnosis not present

## 2019-06-26 DIAGNOSIS — R1031 Right lower quadrant pain: Secondary | ICD-10-CM

## 2019-06-26 DIAGNOSIS — K219 Gastro-esophageal reflux disease without esophagitis: Secondary | ICD-10-CM

## 2019-06-26 DIAGNOSIS — Z8711 Personal history of peptic ulcer disease: Secondary | ICD-10-CM

## 2019-06-26 DIAGNOSIS — R1032 Left lower quadrant pain: Secondary | ICD-10-CM | POA: Diagnosis not present

## 2019-06-26 LAB — CBC WITH DIFFERENTIAL/PLATELET
Basophils Absolute: 0.1 10*3/uL (ref 0.0–0.1)
Basophils Relative: 0.9 % (ref 0.0–3.0)
Eosinophils Absolute: 0.2 10*3/uL (ref 0.0–0.7)
Eosinophils Relative: 2 % (ref 0.0–5.0)
HCT: 42.1 % (ref 36.0–46.0)
Hemoglobin: 14.2 g/dL (ref 12.0–15.0)
Lymphocytes Relative: 29.7 % (ref 12.0–46.0)
Lymphs Abs: 2.3 10*3/uL (ref 0.7–4.0)
MCHC: 33.6 g/dL (ref 30.0–36.0)
MCV: 91.8 fl (ref 78.0–100.0)
Monocytes Absolute: 0.5 10*3/uL (ref 0.1–1.0)
Monocytes Relative: 6.1 % (ref 3.0–12.0)
Neutro Abs: 4.8 10*3/uL (ref 1.4–7.7)
Neutrophils Relative %: 61.3 % (ref 43.0–77.0)
Platelets: 281 10*3/uL (ref 150.0–400.0)
RBC: 4.59 Mil/uL (ref 3.87–5.11)
RDW: 12.8 % (ref 11.5–15.5)
WBC: 7.9 10*3/uL (ref 4.0–10.5)

## 2019-06-26 LAB — COMPREHENSIVE METABOLIC PANEL
ALT: 8 U/L (ref 0–35)
AST: 12 U/L (ref 0–37)
Albumin: 4.1 g/dL (ref 3.5–5.2)
Alkaline Phosphatase: 83 U/L (ref 39–117)
BUN: 15 mg/dL (ref 6–23)
CO2: 23 mEq/L (ref 19–32)
Calcium: 9.4 mg/dL (ref 8.4–10.5)
Chloride: 107 mEq/L (ref 96–112)
Creatinine, Ser: 0.77 mg/dL (ref 0.40–1.20)
GFR: 77.58 mL/min (ref >60.00)
Glucose, Bld: 114 mg/dL — ABNORMAL HIGH (ref 70–99)
Potassium: 4 mEq/L (ref 3.5–5.1)
Sodium: 139 mEq/L (ref 135–145)
Total Bilirubin: 0.3 mg/dL (ref 0.2–1.2)
Total Protein: 7 g/dL (ref 6.0–8.3)

## 2019-06-26 LAB — LIPASE: Lipase: 44 U/L (ref 11.0–59.0)

## 2019-06-26 NOTE — Patient Instructions (Signed)
If you are age 56 or older, your body mass index should be between 23-30. Your Body mass index is 29.39 kg/m. If this is out of the aforementioned range listed, please consider follow up with your Primary Care Provider.  If you are age 69 or younger, your body mass index should be between 19-25. Your Body mass index is 29.39 kg/m. If this is out of the aformentioned range listed, please consider follow up with your Primary Care Provider.   Please go to the lab at [facility] ([place] You will need to go to level "B", you do not need an appointment for this. Hours available are 7:30 am - 4:30 pm.   You have been [facility] [place] [id number] [address]   You are scheduled on 07/03/19 at [place] should arrive 15 minutes prior to your appointment time for registration. Please follow the written instructions below on the day of your exam:  WARNING: IF YOU ARE ALLERGIC TO IODINE/X-RAY DYE, PLEASE NOTIFY RADIOLOGY IMMEDIATELY AT [phone]! YOU WILL BE GIVEN A 13 HOUR PREMEDICATION PREP.  1) Do not eat or drink anything after 5am (4 hours prior to your test) 2) You have been given 2 bottles of oral contrast to drink. The solution may taste better if refrigerated, but do NOT add ice or any other liquid to this solution. Shake well before drinking.    Drink 1 bottle of contrast @ 7am (2 hours prior to your exam)  Drink 1 bottle of contrast @ 8am (1 hour prior to your exam)  You may take any medications as prescribed with a small amount of water, if necessary. If you take any of the following medications: METFORMIN, GLUCOPHAGE, GLUCOVANCE, AVANDAMET, RIOMET, FORTAMET, [place] MET, JANUMET, GLUMETZA or METAGLIP, you MAY be asked to HOLD this medication 48 hours AFTER the exam.  The purpose of you drinking the oral contrast is to aid in the visualization of your intestinal  tract. The contrast solution may cause some diarrhea. Depending on your individual set of symptoms, you may also receive an intravenous injection of x-ray contrast/dye. Plan on being at [facility] for 30 minutes or longer, depending on the type of exam you are having performed.  This test typically takes 30-45 minutes to complete.  If you have any questions regarding your exam or if you need to reschedule, you may call the CT department at [phone] between the hours of 8:00 am and 5:00 pm, Monday-Friday.  __________________________________________________________  Dennis Bast will be due for a recall EGD in 08/2019. We will send you a reminder in the mail when it gets closer to that time.  Thank you,  Dr. Jackquline Denmark

## 2019-06-26 NOTE — Progress Notes (Signed)
Chief Complaint: fu  Referring Provider: Dr. Sheran Fava      ASSESSMENT AND PLAN;   #1.  Left lower quadrant abdominal pain, now RLQ.  Has been treated previously empirically with antibiotics for presumed diverticulitis. Colon 05/2019 neg except for small colonic polyps, moderate diverticulosis. #2.  Gastric ulcer on EGD 05/16/2019. Neg Bx for HP. #3.  GERD with EE. #4.  IBS with alternating diarrhea and constipation.  Plan: - Nexium 40 mg p.o. bid to continue. - Finish Carafate. - CBC, CMP, lipase. - CT scan abdo/pelvis with PO/IV contrast. - I have instructed patient to stop smoking.  Have discussed risks associated with smoking including risks of various cancers. - Rpt EGD Oct 2020 to ensure healing of the ulcers. - If she still complains of throat problems, would recommend trial of GI cocktail followed by ENT consultation.     HPI:    Nancy Cervantes is a 58 y.o. female  For follow-up visit. S/P EGD and colonoscopy 05/16/2019.  EGD showed gastric ulcer with negative biopsies for H. pylori, erosive esophagitis and mild Candida esophagitis.  She was initially treated with Protonix and then switched to Nexium (as Nexium worked better), treated with Diflucan.  Still had some heartburn and given trial of Carafate which she is currently taking.  Advised to get repeat EGD in 12 weeks to ensure healing of the ulcers.  She has not taking any nonsteroidals since EGD.  Colonoscopy on 05/16/2019 revealed small colonic polyp status post polypectomy, predominantly sigmoid diverticulosis. Bx TA.  Advised repeat colonoscopy in 7 years.  Earlier, if with any new problems.  Has been having new right lower quadrant abdominal pain and chronic left lower quadrant abdominal pain - moderate, nonradiating, intermittent, does get some relief with defecation. Seen Dr. Burnett Sheng, started on Cipro 500 mg p.o. twice daily for 7 days for presumed diverticulitis.  No fever or chills.  Does complain of abdominal  bloating with alternating diarrhea and constipation.  Had more constipation previously.  Would pass pellet-like stools.  When she has diarrhea she has softer bowel movements at the frequency of 1 to 2/day after eating.  No melena or hematochezia.  No weight loss.  Continues to smoke despite medical advice.  " black throat"   Review of past records: Limited records available at the present time -Colonoscopy 02/2004 ([facility])-small colonic polyp status post polypectomy, moderate sigmoid diverticulosis. -EGD 05/2001 H. pylori gastric ulcers.  EGD 08/2012 Dr. Bettina Gavia hiatal hernia, deformed antrum due to previous peptic ulcer disease, small antral ulcer.  Biopsies negative for HP Past Medical History:  Diagnosis Date  . Anemia   . Anxiety   . Blood transfusion without reported diagnosis 2003   after "bleeding ulcer"  . Cataract    bilateral (no surgery)  . Depression   . GERD (gastroesophageal reflux disease)   . Neuromuscular disorder ([place]) 2005   peripheral vascular disease  . Osteoarthritis   . Restless leg syndrome     Past Surgical History:  Procedure Laterality Date  . bilateral tubal ligation    . bladder mesh  2011  . CHOLECYSTECTOMY    . EGD  08/28/2012   Small active ulcer. Deformed antrum from prior ulcer. Hiatal hernia.   . Right and Left foot surgeries    . SPINAL CORD STIMULATOR IMPLANT     2009, 2011 by Dr [employer]  . SPINAL CORD STIMULATOR REMOVAL     2009 and 2011  . SPINAL FUSION  12/17/2018  [facility]    Family History  Problem Relation Age of Onset  . Colon cancer Cousin   . Colon cancer Maternal Uncle     Social History   Tobacco Use  . Smoking status: Current Every Day Smoker  . Smokeless tobacco: Never Used  Substance Use Topics  . Alcohol use: Not Currently    Frequency: Never  . Drug use: Never    Current Outpatient Medications  Medication Sig Dispense Refill  . esomeprazole (NEXIUM) 40 MG capsule Take 1 capsule (40 mg  total) by mouth 2 (two) times daily before a meal. 60 capsule 11  . HYDROcodone-acetaminophen (NORCO/VICODIN) 5-325 MG tablet Take 4 tablets by mouth QID.     Mar[place] sucralfate (CARAFATE) 1 GM/10ML suspension Take 10 mLs (1 g total) by mouth 2 (two) times daily for 28 doses. 280 mL 0   Current Facility-Administered Medications  Medication Dose Route Frequency Provider Last Rate Last Dose  . 0.9 %  sodium chloride infusion  500 mL Intravenous Once Jackquline Denmark, MD        Allergies  Allergen Reactions  . Amitriptyline   . Gabapentin Itching and Nausea And Vomiting  . Latex Rash  . Morphine And Related Palpitations  . Morpholine Salicylate Palpitations  . Neomycin-Bacitracin Zn-Polymyx Rash  . Pregabalin Nausea And Vomiting  . Topiramate Rash    Review of Systems:  neg     Physical Exam:    BP 128/84   Pulse 87   Temp 98.1 F (36.7 C)   Ht 5\' 4"  (1.626 m)   Wt 171 lb 4 oz (77.7 kg)   BMI 29.39 kg/m  Filed Weights   06/26/19 1048  Weight: 171 lb 4 oz (77.7 kg)   Constitutional:  Well-developed, in no acute distress. Psychiatric: Normal mood and affect. Behavior is normal. HEENT: Pupils normal.  Conjunctivae are normal. No scleral icterus.  Throat appears to be normal. Neck supple.  Cardiovascular: Normal rate, regular rhythm. No edema Pulmonary/chest: Effort normal and breath sounds normal. No wheezing, rales or rhonchi. Abdominal: Soft, nondistended. Nontender. Bowel sounds active throughout. There are no masses palpable. No hepatomegaly. Rectal:  defered Neurological: Alert and oriented to person place and time. Skin: Skin is warm and dry. No rashes noted.  Data Reviewed: I have personally reviewed following labs and imaging studies  CBC: CBC Latest Ref Rng & Units 07/23/2018 07/23/2018 04/15/2009  WBC 4.0 - 10.5 K/uL - 7.3 6.3  Hemoglobin 12.0 - 15.0 g/dL 13.3 13.8 10.2(L)  Hematocrit 36.0 - 46.0 % 39.0 41.2 31.4(L)  Platelets 150 - 400 K/uL - 232 360    CMP: CMP  Latest Ref Rng & Units 07/23/2018 07/23/2018 04/15/2009  Glucose 70 - 99 mg/dL 125(H) 126(H) 82  BUN 6 - 20 mg/dL 14 13 4(L)  Creatinine 0.44 - 1.00 mg/dL 0.70 0.87 0.74  Sodium 135 - 145 mmol/L 143 143 140  Potassium 3.5 - 5.1 mmol/L 3.7 3.8 3.8  Chloride 98 - 111 mmol/L 109 108 109  CO2 22 - 32 mmol/L - 23 26  Calcium 8.9 - 10.3 mg/dL - 8.8(L) 8.7  Total Protein 6.5 - 8.1 g/dL - 6.3(L) 5.9(L)  Total Bilirubin 0.3 - 1.2 mg/dL - 0.4 0.3  Alkaline Phos 38 - 126 U/L - 92 79  AST 15 - 41 U/L - 21 19  ALT 0 - 44 U/L - 18 15  Labs reviewed from 04/18/2019 -Normal CBC with hemoglobin 14.4, WBC count 8.3, platelets 217 -Normal CMP sodium 143,  BUN 13, creatinine 0.78, normal liver function tests     Carmell Austria, MD 06/26/2019, 10:55 AM  Cc: Dr. Sheran Fava

## 2019-07-03 ENCOUNTER — Other Ambulatory Visit: Payer: Self-pay

## 2019-07-03 ENCOUNTER — Ambulatory Visit (HOSPITAL_BASED_OUTPATIENT_CLINIC_OR_DEPARTMENT_OTHER)
Admission: RE | Admit: 2019-07-03 | Discharge: 2019-07-03 | Disposition: A | Discharge status: Home / Self Care | Payer: Medicare Other | Source: Ambulatory Visit | Attending: Gastroenterology | Admitting: Gastroenterology

## 2019-07-03 ENCOUNTER — Encounter (HOSPITAL_BASED_OUTPATIENT_CLINIC_OR_DEPARTMENT_OTHER): Payer: Self-pay

## 2019-07-03 DIAGNOSIS — K582 Mixed irritable bowel syndrome: Secondary | ICD-10-CM

## 2019-07-03 DIAGNOSIS — M25511 Pain in right shoulder: Secondary | ICD-10-CM | POA: Diagnosis not present

## 2019-07-03 DIAGNOSIS — M25512 Pain in left shoulder: Secondary | ICD-10-CM | POA: Diagnosis not present

## 2019-07-03 DIAGNOSIS — Z8711 Personal history of peptic ulcer disease: Secondary | ICD-10-CM

## 2019-07-03 DIAGNOSIS — M5442 Lumbago with sciatica, left side: Secondary | ICD-10-CM | POA: Diagnosis not present

## 2019-07-03 DIAGNOSIS — M542 Cervicalgia: Secondary | ICD-10-CM | POA: Diagnosis not present

## 2019-07-03 DIAGNOSIS — R1031 Right lower quadrant pain: Secondary | ICD-10-CM | POA: Insufficient documentation

## 2019-07-03 DIAGNOSIS — R1110 Vomiting, unspecified: Secondary | ICD-10-CM | POA: Diagnosis not present

## 2019-07-03 DIAGNOSIS — K219 Gastro-esophageal reflux disease without esophagitis: Secondary | ICD-10-CM | POA: Diagnosis not present

## 2019-07-03 DIAGNOSIS — Z8719 Personal history of other diseases of the digestive system: Secondary | ICD-10-CM | POA: Insufficient documentation

## 2019-07-03 DIAGNOSIS — G8929 Other chronic pain: Secondary | ICD-10-CM | POA: Insufficient documentation

## 2019-07-03 DIAGNOSIS — R1032 Left lower quadrant pain: Secondary | ICD-10-CM | POA: Diagnosis not present

## 2019-07-03 DIAGNOSIS — Z1211 Encounter for screening for malignant neoplasm of colon: Secondary | ICD-10-CM | POA: Insufficient documentation

## 2019-07-03 DIAGNOSIS — M5441 Lumbago with sciatica, right side: Secondary | ICD-10-CM | POA: Diagnosis not present

## 2019-07-03 MED ORDER — IOHEXOL 300 MG/ML  SOLN
100.0000 mL | Freq: Once | INTRAMUSCULAR | Status: AC | PRN
  Administered 2019-07-03: 09:00:00 100 mL via INTRAVENOUS

## 2019-07-09 ENCOUNTER — Telehealth: Payer: Self-pay | Admitting: Gastroenterology

## 2019-07-09 NOTE — Telephone Encounter (Signed)
Left message for patient to call back  

## 2019-07-10 NOTE — Telephone Encounter (Signed)
Patient has called into the office-patient is requesting to know if "MOLD in my house" is what is making her sick and the reason for her issues-found out on 07/09/2019 she has mold throughout her house- Patient reports she has several places on her skin, including her scalp, that are with white liquid and are sore;  Wants to know if living in her house with MOLD for over a year-?is this what is causing her to have issues and to be sick and breaking out? Reports she has not informed her PCP (Dr. Burnett Sheng) as of yet- Please advise

## 2019-07-12 NOTE — Telephone Encounter (Signed)
Mold can cause problems but not so sure if her problems are related to mold. The best would be to make appointment with Dr.Kozlow.  He can also do skin testing.  Should be referred by Dr. Burnett Sheng  Thx  RG

## 2019-07-15 NOTE — Telephone Encounter (Signed)
Left message for patient to call back to the office;  

## 2019-07-15 NOTE — Telephone Encounter (Signed)
Patient returned call to the office- patient informed of recommendations from Dr. Lyndel Safe; patient verbalized understanding of information/instructions; patient advised to call back to the office should questions/concerns arise;

## 2019-07-18 DIAGNOSIS — L299 Pruritus, unspecified: Secondary | ICD-10-CM | POA: Diagnosis not present

## 2019-07-18 DIAGNOSIS — K210 Gastro-esophageal reflux disease with esophagitis: Secondary | ICD-10-CM | POA: Diagnosis not present

## 2019-08-05 ENCOUNTER — Ambulatory Visit: Payer: Self-pay | Admitting: Allergy and Immunology

## 2019-08-12 ENCOUNTER — Ambulatory Visit: Payer: Self-pay | Admitting: Allergy and Immunology

## 2019-08-12 DIAGNOSIS — Z124 Encounter for screening for malignant neoplasm of cervix: Secondary | ICD-10-CM | POA: Diagnosis not present

## 2019-08-12 DIAGNOSIS — Z720 Tobacco use: Secondary | ICD-10-CM | POA: Diagnosis not present

## 2019-08-15 DIAGNOSIS — I708 Atherosclerosis of other arteries: Secondary | ICD-10-CM | POA: Diagnosis not present

## 2019-08-28 DIAGNOSIS — M5442 Lumbago with sciatica, left side: Secondary | ICD-10-CM | POA: Diagnosis not present

## 2019-08-28 DIAGNOSIS — Z981 Arthrodesis status: Secondary | ICD-10-CM | POA: Diagnosis not present

## 2019-08-28 DIAGNOSIS — Z79891 Long term (current) use of opiate analgesic: Secondary | ICD-10-CM | POA: Diagnosis not present

## 2019-08-28 DIAGNOSIS — M542 Cervicalgia: Secondary | ICD-10-CM | POA: Diagnosis not present

## 2019-08-28 DIAGNOSIS — M5441 Lumbago with sciatica, right side: Secondary | ICD-10-CM | POA: Diagnosis not present

## 2019-08-28 DIAGNOSIS — G8929 Other chronic pain: Secondary | ICD-10-CM | POA: Diagnosis not present

## 2019-08-30 IMAGING — CT CT HEAD CODE STROKE
3 series · 14 of 47 positions shown, 16 images · non-contrast
Comparison: None.

CLINICAL DATA: Code stroke. Slurred speech, left-sided weakness.
Headache.

EXAM:
CT HEAD WITHOUT CONTRAST
TECHNIQUE: Contiguous axial images were obtained from the base of the skull
through the vertex without intravenous contrast.

[Series 3: head 5.0 h30s · axial · 0.46mm/px · z∈[-48,+97]mm · 8 of 35 slices shown, 10 images]
[im 3/35  brain]
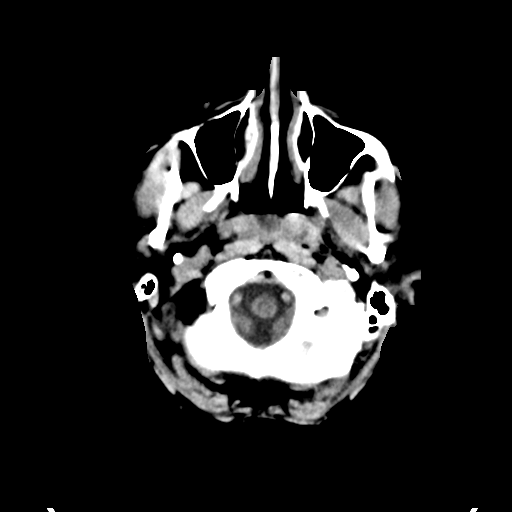
[im 3/35  bone]
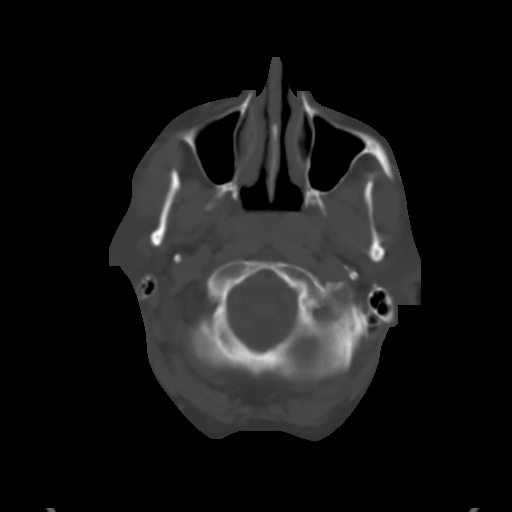
[im 8/35  brain]
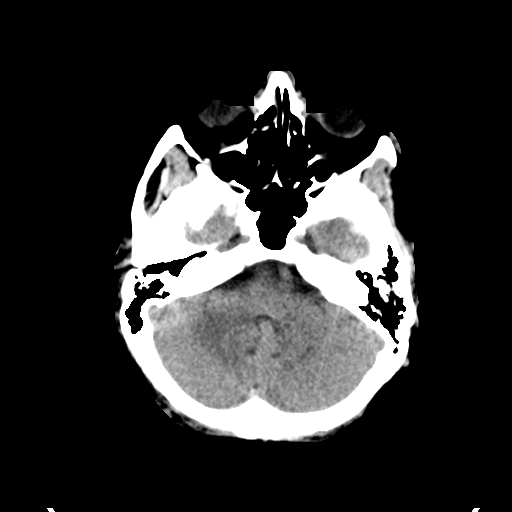
[im 11/35  brain]
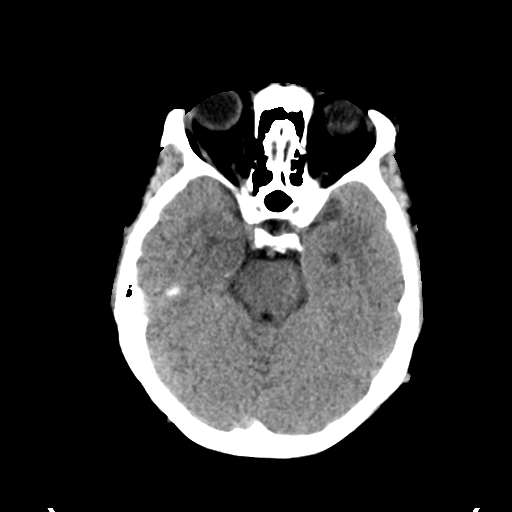
[im 16/35  brain]
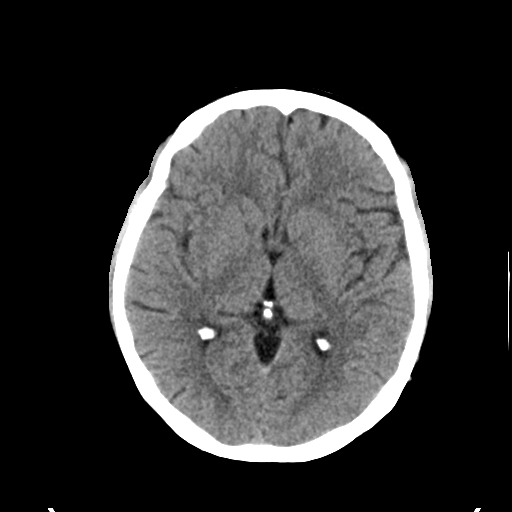
[im 19/35  brain]
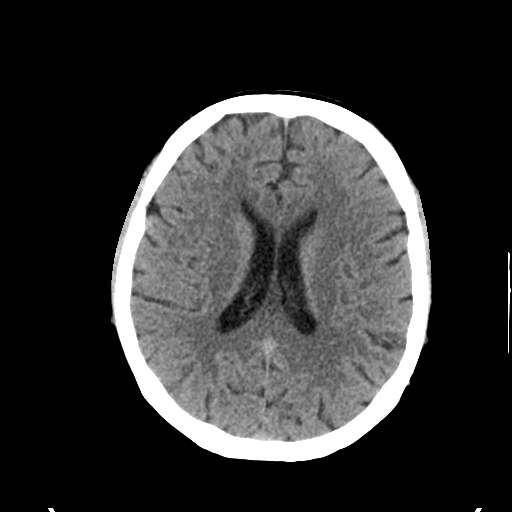
[im 19/35  bone]
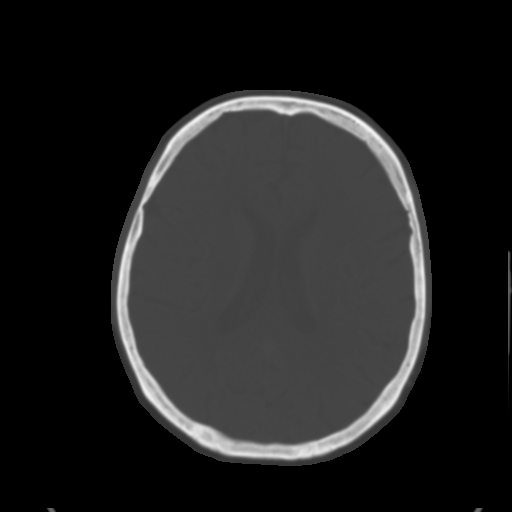
[im 24/35  brain]
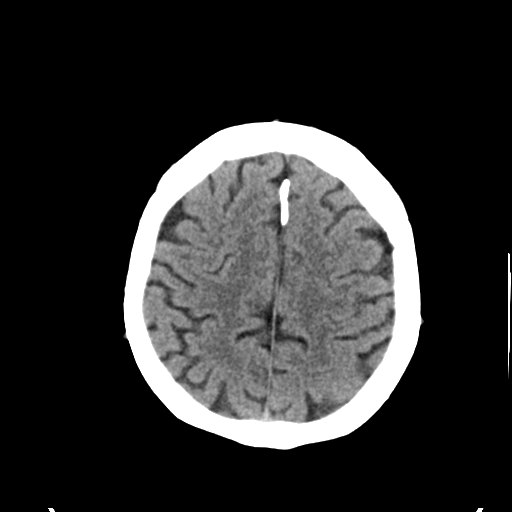
[im 27/35  brain]
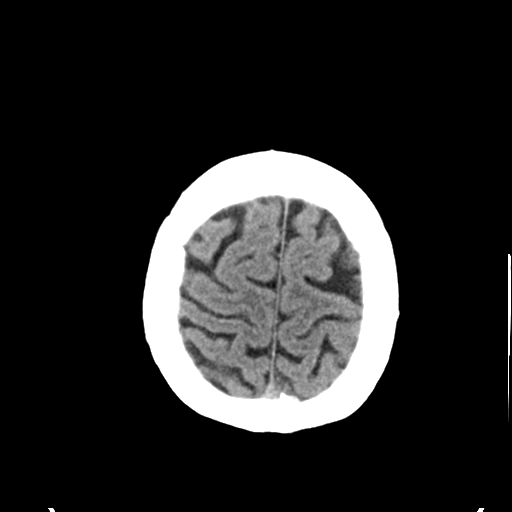
[im 32/35  brain]
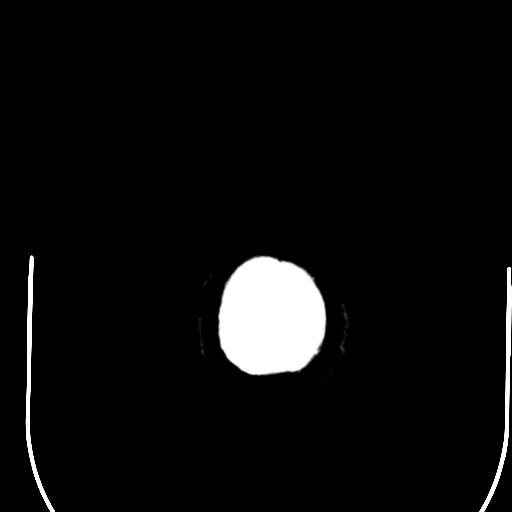

[Series 5: head 3.0 mpr cor · coronal · 0.35mm/px · 3 of 68 slices shown]
[im 23/68  brain]
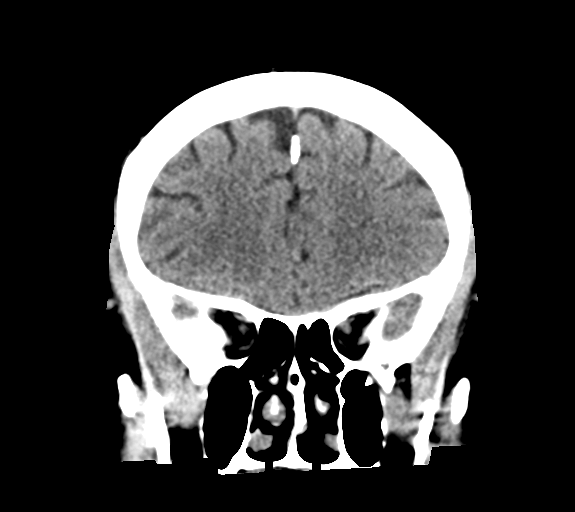
[im 30/68  brain]
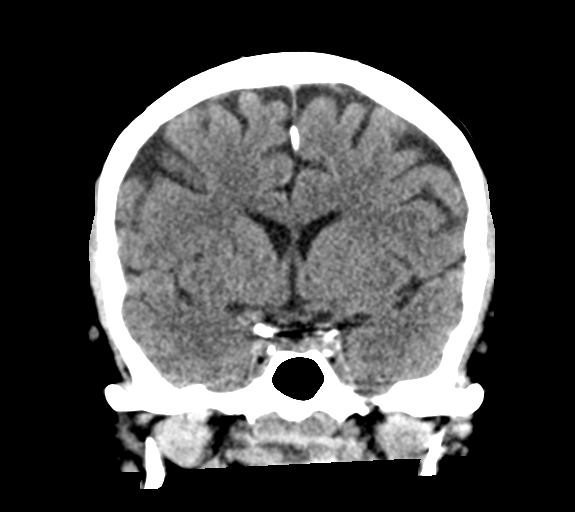
[im 38/68  brain]
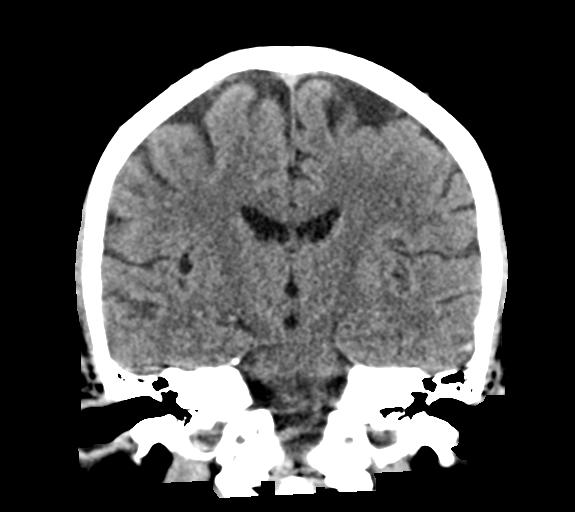

[Series 6: head 3.0 mpr sag · sagittal · 0.35mm/px · 3 of 63 slices shown]
[im 21/63  brain]
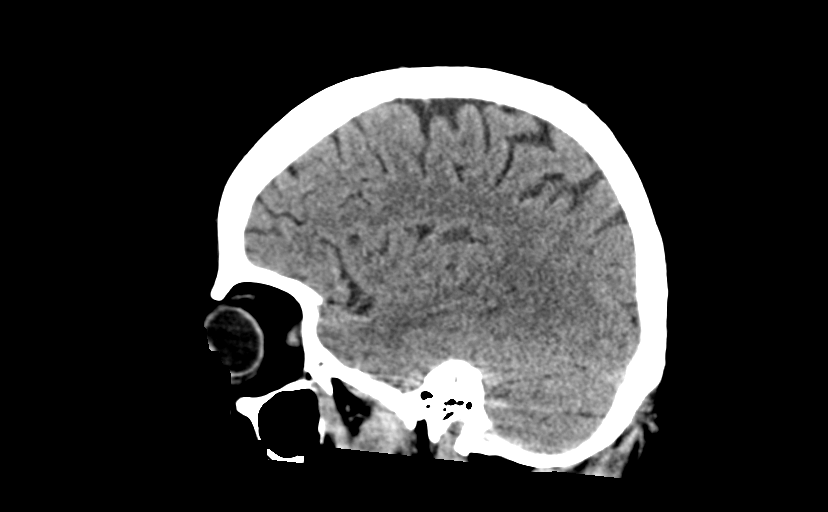
[im 32/63  brain]
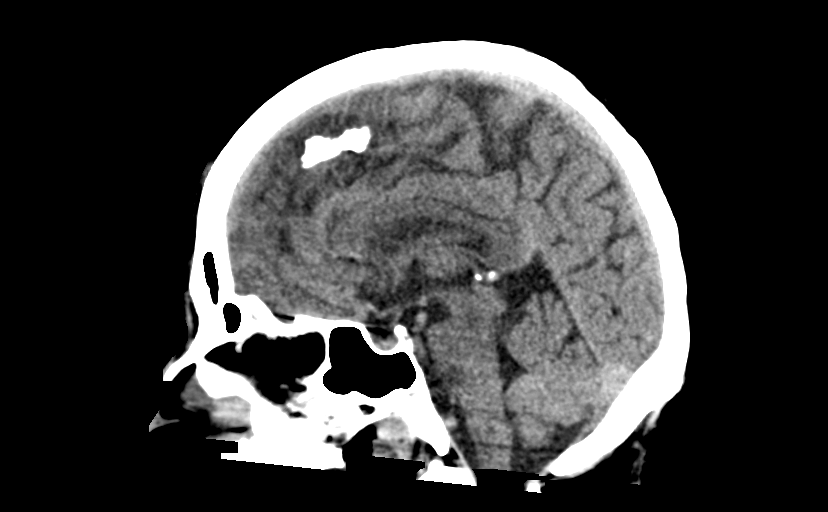
[im 42/63  brain]
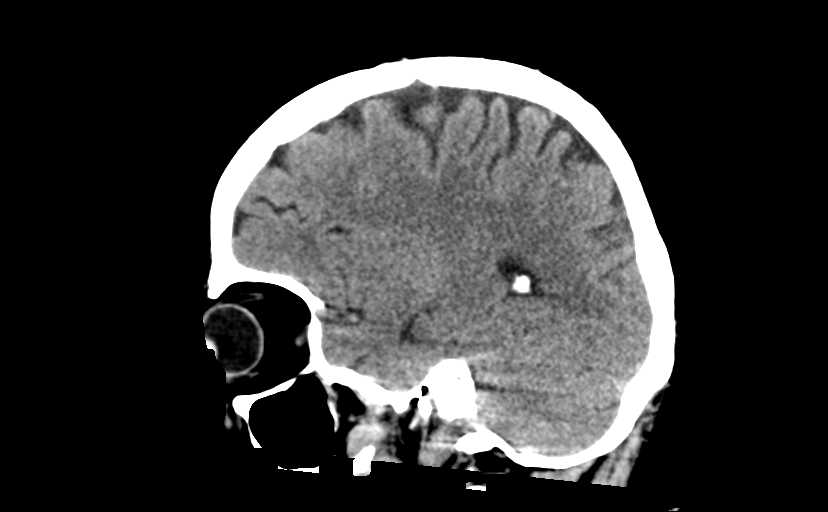

[14 of 47 positions shown; findings below may reference images not displayed]

FINDINGS: Brain: Negative for acute infarct. Negative for acute hemorrhage or
mass. Negative for hydrocephalus. No significant chronic ischemia.

Vascular: Negative for hyperdense vessel

Skull: Negative

Sinuses/Orbits: Negative

Other: None

ASPECTS (Alberta Stroke Program Early CT Score)

- Ganglionic level infarction (caudate, lentiform nuclei, internal
capsule, insula, M1-M3 cortex): 7

- Supraganglionic infarction (M4-M6 cortex): 3

Total score (0-10 with 10 being normal): 10
IMPRESSION: 1. Negative CT head
2. ASPECTS is 10
3. These results were called by telephone at the time of
interpretation on 07/23/2018 at [DATE] to Dr. Dez, who verbally
acknowledged these results.

## 2019-10-25 ENCOUNTER — Encounter: Payer: Self-pay | Admitting: Gastroenterology

## 2019-10-28 DIAGNOSIS — I708 Atherosclerosis of other arteries: Secondary | ICD-10-CM | POA: Diagnosis not present

## 2019-10-28 DIAGNOSIS — M5010 Cervical disc disorder with radiculopathy, unspecified cervical region: Secondary | ICD-10-CM | POA: Diagnosis not present

## 2019-11-12 DIAGNOSIS — T887XXA Unspecified adverse effect of drug or medicament, initial encounter: Secondary | ICD-10-CM | POA: Diagnosis not present

## 2019-11-14 ENCOUNTER — Other Ambulatory Visit: Payer: Self-pay

## 2019-11-14 NOTE — Patient Outreach (Signed)
[facility]) Care Management  11/14/2019  JULLISA NAATZ 12-Mar-1961 [id number]   Medication Adherence call to Mrs. Quinnleigh Nakada HIPPA Compliant Voice message left with a call back number. Mrs. Simington is showing past due on Rosuvastatin 20 mg under [place].   [facility] Management Direct Dial [phone]  Fax [phone] [email or web address]

## 2019-11-20 ENCOUNTER — Other Ambulatory Visit: Payer: Self-pay

## 2019-11-20 NOTE — Patient Outreach (Signed)
[facility]) Care Management  11/20/2019  Nancy Cervantes 12-25-1960 [id number]   Medication Adherence call to Nancy Cervantes Telephone call to Patient regarding Medication Adherence unable to reach patient. Nancy Cervantes is showing past due on Rosuvastatin 20 mg under [place].   [facility] Management Direct Dial [phone]  Fax [phone] [email or web address]

## 2019-11-27 DIAGNOSIS — Z79891 Long term (current) use of opiate analgesic: Secondary | ICD-10-CM | POA: Diagnosis not present

## 2019-11-27 DIAGNOSIS — G8929 Other chronic pain: Secondary | ICD-10-CM | POA: Diagnosis not present

## 2019-11-27 DIAGNOSIS — Z981 Arthrodesis status: Secondary | ICD-10-CM | POA: Diagnosis not present

## 2019-11-27 DIAGNOSIS — M5442 Lumbago with sciatica, left side: Secondary | ICD-10-CM | POA: Diagnosis not present

## 2019-11-27 DIAGNOSIS — M5441 Lumbago with sciatica, right side: Secondary | ICD-10-CM | POA: Diagnosis not present

## 2019-11-27 DIAGNOSIS — G4700 Insomnia, unspecified: Secondary | ICD-10-CM | POA: Diagnosis not present

## 2019-12-12 DIAGNOSIS — M791 Myalgia, unspecified site: Secondary | ICD-10-CM | POA: Diagnosis not present

## 2019-12-12 DIAGNOSIS — T887XXD Unspecified adverse effect of drug or medicament, subsequent encounter: Secondary | ICD-10-CM | POA: Diagnosis not present

## 2019-12-12 DIAGNOSIS — R21 Rash and other nonspecific skin eruption: Secondary | ICD-10-CM | POA: Diagnosis not present

## 2020-01-19 DIAGNOSIS — Z5321 Procedure and treatment not carried out due to patient leaving prior to being seen by health care provider: Secondary | ICD-10-CM | POA: Diagnosis not present

## 2020-01-19 DIAGNOSIS — R0600 Dyspnea, unspecified: Secondary | ICD-10-CM | POA: Diagnosis not present

## 2020-01-20 DIAGNOSIS — M791 Myalgia, unspecified site: Secondary | ICD-10-CM | POA: Diagnosis not present

## 2020-01-20 DIAGNOSIS — R079 Chest pain, unspecified: Secondary | ICD-10-CM | POA: Diagnosis not present

## 2020-01-20 DIAGNOSIS — K219 Gastro-esophageal reflux disease without esophagitis: Secondary | ICD-10-CM | POA: Diagnosis not present

## 2020-01-27 ENCOUNTER — Other Ambulatory Visit: Payer: Self-pay | Admitting: Podiatry

## 2020-01-27 DIAGNOSIS — M79672 Pain in left foot: Secondary | ICD-10-CM

## 2020-01-27 DIAGNOSIS — M79671 Pain in right foot: Secondary | ICD-10-CM

## 2020-01-28 ENCOUNTER — Ambulatory Visit: Payer: Medicare Other | Admitting: Podiatry

## 2020-01-28 DIAGNOSIS — H04123 Dry eye syndrome of bilateral lacrimal glands: Secondary | ICD-10-CM | POA: Diagnosis not present

## 2020-02-04 DIAGNOSIS — L219 Seborrheic dermatitis, unspecified: Secondary | ICD-10-CM | POA: Diagnosis not present

## 2020-02-04 DIAGNOSIS — L281 Prurigo nodularis: Secondary | ICD-10-CM | POA: Diagnosis not present

## 2020-02-11 DIAGNOSIS — H04123 Dry eye syndrome of bilateral lacrimal glands: Secondary | ICD-10-CM | POA: Diagnosis not present

## 2020-02-20 DIAGNOSIS — M5441 Lumbago with sciatica, right side: Secondary | ICD-10-CM | POA: Diagnosis not present

## 2020-02-20 DIAGNOSIS — Z79891 Long term (current) use of opiate analgesic: Secondary | ICD-10-CM | POA: Diagnosis not present

## 2020-02-20 DIAGNOSIS — M5442 Lumbago with sciatica, left side: Secondary | ICD-10-CM | POA: Diagnosis not present

## 2020-02-20 DIAGNOSIS — Z760 Encounter for issue of repeat prescription: Secondary | ICD-10-CM | POA: Diagnosis not present

## 2020-02-20 DIAGNOSIS — G8929 Other chronic pain: Secondary | ICD-10-CM | POA: Diagnosis not present

## 2020-03-06 ENCOUNTER — Other Ambulatory Visit: Payer: Self-pay

## 2020-03-07 NOTE — Progress Notes (Deleted)
Cardiology Office Note:    Date:  03/07/2020   ID:  Nancy Cervantes, DOB [birth year], MRN [id number]  PCP:  Greig Right, MD  Cardiologist:  Shirlee More, MD   Referring MD: Greig Right, MD  ASSESSMENT:    No diagnosis found. PLAN:    In order of problems listed above:  1. ***  Next appointment   Medication Adjustments/Labs and Tests Ordered: Current medicines are reviewed at length with the patient today.  Concerns regarding medicines are outlined above.  No orders of the defined types were placed in this encounter.  No orders of the defined types were placed in this encounter.    No chief complaint on file. ***  History of Present Illness:    Nancy Cervantes is a 59 y.o. female with a history of esophageal reflux and aortic atherosclerosis seen on CT scan who is being seen today for the evaluation of chest pain at the request of Greig Right, MD.   Past Medical History:  Diagnosis Date  . Anemia   . Anxiety   . Blood transfusion without reported diagnosis 2003   after "bleeding ulcer"  . Burning sensation 02/05/2015  . Cataract    bilateral (no surgery)  . Chronic bilateral low back pain with bilateral sciatica 01/16/2012   Last Assessment & Plan:  Formatting of this note might be different from the original. 59 year old female with chronic low back pain, and now reporting radiation of pain to the hips today.  We reviewed previous plain films of the lumbar spine were completed in 2016 showing mild degenerative facet joint changes at L4-5 and L5-S1.  No compression fracture or significant disc disease.  She has no wea  . Chronic pain of both shoulders 02/08/2019   Last Assessment & Plan:  Formatting of this note might be different from the original. Patient has had a recent intra-articular injections at another clinic with good benefit.  . Depression   . Gastritis 06/03/2019   Last Assessment & Plan:  Formatting of this note might be different from the original.  Intolerant to NSAIDs due to gastritis.  Last Assessment & Plan:  Formatting of this note might be different from the original. Patient recently underwent EGD and was found to have gastritis as well as Candida esophagitis.  We will discontinue diclofenac.  Marland Kitchen GERD (gastroesophageal reflux disease)   . Gross hematuria 01/23/2018  . Hx of gastric ulcer 05/19/2016  . Hypothyroidism, unspecified 08/17/2016  . Idiopathic peripheral neuropathy 04/24/2018  . Insomnia 01/13/2016   Last Assessment & Plan:  Formatting of this note might be different from the original. Failed to respond to trazodone and amitriptyline  Last Assessment & Plan:  Formatting of this note might be different from the original. Intolerant to both trazodone Amitriptyline.  Doing well with recent addition of melatonin per PCP.  . Leg pain, bilateral 04/24/2018  . Lightheadedness 04/17/2018  . Lumbar radiculopathy 01/16/2012  . Neck pain 02/08/2019   Last Assessment & Plan:  Formatting of this note might be different from the original. Patient reports being status post ACDF in January.  . Neuromuscular disorder ([place]) 2005   peripheral vascular disease  . Neuropathy of lower extremity 01/16/2012  . Osteoarthritis   . Pain medication agreement 02/08/2019   Last Assessment & Plan:  Formatting of this note might be different from the original. UDS completed today.  Previous UDS results reviewed, 1/4 abnormal.  Will continue to monitor.  [place] controlled substance registry reviewed and  there were no inconsistencies noted.  Last Assessment & Plan:  Formatting of this note might be different from the original. Review of prior UDS results are fou  . Restless leg syndrome   . Restless legs 01/23/2018   February 05, 2015 2:43 PM Restless legs. Pt has multiple complaints but the main one is restless legs with burning pain. Looking through the records she was seen since 2008 by neurology at [facility] for the same issues. She has been on multiple medications and  had extensive testing with minimal results. The last neurologist was Dr. Macario Carls in [place] but I don't have those records. She is on requip at 0.5mg /0.7  . Right flank pain 04/19/2017  . S/P cervical spinal fusion 06/03/2019   Last Assessment & Plan:  Formatting of this note might be different from the original. History of cervical fusion in January of 2020. Stable and doing well at this time.  Last Assessment & Plan:  Formatting of this note might be different from the original. A little over 1 year status post cervical fusion, doing well without any progression of symptoms.  Continue chronic regimen as outlined in chr    Past Surgical History:  Procedure Laterality Date  . bilateral tubal ligation    . bladder mesh  2011  . CHOLECYSTECTOMY    . EGD  08/28/2012   Small active ulcer. Deformed antrum from prior ulcer. Hiatal hernia.   . Right and Left foot surgeries    . SPINAL CORD STIMULATOR IMPLANT     2009, 2011 by Dr [employer]  . SPINAL CORD STIMULATOR REMOVAL     2009 and 2011  . SPINAL FUSION  12/17/2018   [facility]    Current Medications: No outpatient medications have been marked as taking for the 03/09/20 encounter (Appointment) with Richardo Priest, MD.   Current Facility-Administered Medications for the 03/09/20 encounter (Appointment) with Richardo Priest, MD  Medication  . 0.9 %  sodium chloride infusion     Allergies:   Amitriptyline, Bacitracin-neomycin-polymyxin, Gabapentin, Latex, Morphine and related, Morpholine salicylate, Neomycin-bacitracin zn-polymyx, Other, Pregabalin, and Topiramate   Social History   Socioeconomic History  . Marital status: Married    Spouse name: Not on file  . Number of children: Not on file  . Years of education: Not on file  . Highest education level: Not on file  Occupational History  . Not on file  Tobacco Use  . Smoking status: Current Every Day Smoker  . Smokeless tobacco: Never Used  Substance and Sexual Activity  . Alcohol  use: Not Currently  . Drug use: Never  . Sexual activity: Not on file  Other Topics Concern  . Not on file  Social History Narrative  . Not on file   Social Determinants of Health   Financial Resource Strain:   . Difficulty of Paying Living Expenses:   Food Insecurity:   . Worried About Charity fundraiser in the Last Year:   . Arboriculturist in the Last Year:   Transportation Needs:   . Film/video editor (Medical):   Marland Kitchen Lack of Transportation (Non-Medical):   Physical Activity:   . Days of Exercise per Week:   . Minutes of Exercise per Session:   Stress:   . Feeling of Stress :   Social Connections:   . Frequency of Communication with Friends and Family:   . Frequency of Social Gatherings with Friends and Family:   . Attends Religious Services:   .  Active Member of Clubs or Organizations:   . Attends Archivist Meetings:   Marland Kitchen Marital Status:      Family History: The patient's ***family history includes Colon cancer in her cousin and maternal uncle.  ROS:   ROS Please see the history of present illness.    *** All other systems reviewed and are negative.  EKGs/Labs/Other Studies Reviewed:    The following studies were reviewed today: ***  EKG:  EKG is *** ordered today.  The ekg ordered today is personally reviewed and demonstrates ***  Recent Labs: 06/26/2019: ALT 8; BUN 15; Creatinine, Ser 0.77; Hemoglobin 14.2; Platelets 281.0; Potassium 4.0; Sodium 139  Recent Lipid Panel No results found for: CHOL, TRIG, HDL, CHOLHDL, VLDL, LDLCALC, LDLDIRECT  Physical Exam:    VS:  There were no vitals taken for this visit.    Wt Readings from Last 3 Encounters:  06/26/19 171 lb 4 oz (77.7 kg)  05/16/19 168 lb (76.2 kg)  04/24/19 168 lb (76.2 kg)     GEN: *** Well nourished, well developed in no acute distress HEENT: Normal NECK: No JVD; No carotid bruits LYMPHATICS: No lymphadenopathy CARDIAC: ***RRR, no murmurs, rubs, gallops RESPIRATORY:  Clear  to auscultation without rales, wheezing or rhonchi  ABDOMEN: Soft, non-tender, non-distended MUSCULOSKELETAL:  No edema; No deformity  SKIN: Warm and dry NEUROLOGIC:  Alert and oriented x 3 PSYCHIATRIC:  Normal affect     Signed, Shirlee More, MD  03/07/2020 2:57 PM    [facility]

## 2020-03-09 ENCOUNTER — Ambulatory Visit: Payer: Medicare Other | Admitting: Cardiology

## 2020-03-16 ENCOUNTER — Other Ambulatory Visit: Payer: Self-pay

## 2020-03-16 ENCOUNTER — Ambulatory Visit (INDEPENDENT_AMBULATORY_CARE_PROVIDER_SITE_OTHER): Payer: Medicare Other

## 2020-03-16 ENCOUNTER — Ambulatory Visit (INDEPENDENT_AMBULATORY_CARE_PROVIDER_SITE_OTHER): Payer: Medicare Other | Admitting: Podiatry

## 2020-03-16 ENCOUNTER — Other Ambulatory Visit: Payer: Self-pay | Admitting: Podiatry

## 2020-03-16 DIAGNOSIS — M7662 Achilles tendinitis, left leg: Secondary | ICD-10-CM

## 2020-03-16 DIAGNOSIS — M7732 Calcaneal spur, left foot: Secondary | ICD-10-CM | POA: Diagnosis not present

## 2020-03-16 DIAGNOSIS — M79671 Pain in right foot: Secondary | ICD-10-CM

## 2020-03-16 DIAGNOSIS — M216X9 Other acquired deformities of unspecified foot: Secondary | ICD-10-CM

## 2020-03-16 DIAGNOSIS — M7661 Achilles tendinitis, right leg: Secondary | ICD-10-CM

## 2020-03-16 NOTE — Patient Instructions (Signed)

## 2020-03-23 DIAGNOSIS — R072 Precordial pain: Secondary | ICD-10-CM | POA: Diagnosis not present

## 2020-03-24 DIAGNOSIS — J449 Chronic obstructive pulmonary disease, unspecified: Secondary | ICD-10-CM | POA: Diagnosis not present

## 2020-03-24 DIAGNOSIS — R079 Chest pain, unspecified: Secondary | ICD-10-CM | POA: Diagnosis not present

## 2020-03-24 DIAGNOSIS — R072 Precordial pain: Secondary | ICD-10-CM | POA: Diagnosis not present

## 2020-04-03 ENCOUNTER — Other Ambulatory Visit: Payer: Self-pay | Admitting: Podiatry

## 2020-04-03 DIAGNOSIS — M722 Plantar fascial fibromatosis: Secondary | ICD-10-CM

## 2020-04-06 ENCOUNTER — Ambulatory Visit: Payer: Medicare Other | Admitting: Podiatry

## 2020-04-07 NOTE — Progress Notes (Deleted)
Cardiology Office Note:    Date:  04/07/2020   ID:  Nancy Cervantes, DOB 10/20/1961, MRN [id number]  PCP:  Greig Right, MD  Cardiologist:  Shirlee More, MD   Referring MD: Greig Right, MD  ASSESSMENT:    No diagnosis found. PLAN:    In order of problems listed above:  1. ***  Next appointment   Medication Adjustments/Labs and Tests Ordered: Current medicines are reviewed at length with the patient today.  Concerns regarding medicines are outlined above.  No orders of the defined types were placed in this encounter.  No orders of the defined types were placed in this encounter.    No chief complaint on file. ***  History of Present Illness:    Nancy Cervantes is a 59 y.o. female with a history of COPD who is being seen today for the evaluation of *** at the request of Greig Right, MD.   Past Medical History:  Diagnosis Date  . Anemia   . Anxiety   . Blood transfusion without reported diagnosis 2003   after "bleeding ulcer"  . Burning sensation 02/05/2015  . Cataract    bilateral (no surgery)  . Chronic bilateral low back pain with bilateral sciatica 01/16/2012   Last Assessment & Plan:  Formatting of this note might be different from the original. 59 year old female with chronic low back pain, and now reporting radiation of pain to the hips today.  We reviewed previous plain films of the lumbar spine were completed in 2016 showing mild degenerative facet joint changes at L4-5 and L5-S1.  No compression fracture or significant disc disease.  She has no wea  . Chronic pain of both shoulders 02/08/2019   Last Assessment & Plan:  Formatting of this note might be different from the original. Patient has had a recent intra-articular injections at another clinic with good benefit.  . Depression   . Gastritis 06/03/2019   Last Assessment & Plan:  Formatting of this note might be different from the original. Intolerant to NSAIDs due to gastritis.  Last Assessment & Plan:   Formatting of this note might be different from the original. Patient recently underwent EGD and was found to have gastritis as well as Candida esophagitis.  We will discontinue diclofenac.  Marland Kitchen GERD (gastroesophageal reflux disease)   . Gross hematuria 01/23/2018  . Hx of gastric ulcer 05/19/2016  . Hypothyroidism, unspecified 08/17/2016  . Idiopathic peripheral neuropathy 04/24/2018  . Insomnia 01/13/2016   Last Assessment & Plan:  Formatting of this note might be different from the original. Failed to respond to trazodone and amitriptyline  Last Assessment & Plan:  Formatting of this note might be different from the original. Intolerant to both trazodone Amitriptyline.  Doing well with recent addition of melatonin per PCP.  . Leg pain, bilateral 04/24/2018  . Lightheadedness 04/17/2018  . Lumbar radiculopathy 01/16/2012  . Neck pain 02/08/2019   Last Assessment & Plan:  Formatting of this note might be different from the original. Patient reports being status post ACDF in January.  . Neuromuscular disorder ([place]) 2005   peripheral vascular disease  . Neuropathy of lower extremity 01/16/2012  . Osteoarthritis   . Pain medication agreement 02/08/2019   Last Assessment & Plan:  Formatting of this note might be different from the original. UDS completed today.  Previous UDS results reviewed, 1/4 abnormal.  Will continue to monitor.  [place] controlled substance registry reviewed and there were no inconsistencies noted.  Last Assessment &  Plan:  Formatting of this note might be different from the original. Review of prior UDS results are fou  . Restless leg syndrome   . Restless legs 01/23/2018   February 05, 2015 2:43 PM Restless legs. Pt has multiple complaints but the main one is restless legs with burning pain. Looking through the records she was seen since 2008 by neurology at [facility] for the same issues. She has been on multiple medications and had extensive testing with minimal results. The last neurologist  was Dr. Macario Carls in [place] but I don't have those records. She is on requip at 0.5mg /0.7  . Right flank pain 04/19/2017  . S/P cervical spinal fusion 06/03/2019   Last Assessment & Plan:  Formatting of this note might be different from the original. History of cervical fusion in January of 2020. Stable and doing well at this time.  Last Assessment & Plan:  Formatting of this note might be different from the original. A little over 1 year status post cervical fusion, doing well without any progression of symptoms.  Continue chronic regimen as outlined in chr    Past Surgical History:  Procedure Laterality Date  . bilateral tubal ligation    . bladder mesh  2011  . CHOLECYSTECTOMY    . EGD  08/28/2012   Small active ulcer. Deformed antrum from prior ulcer. Hiatal hernia.   . Right and Left foot surgeries    . SPINAL CORD STIMULATOR IMPLANT     2009, 2011 by Dr [employer]  . SPINAL CORD STIMULATOR REMOVAL     2009 and 2011  . SPINAL FUSION  12/17/2018   [facility]    Current Medications: No outpatient medications have been marked as taking for the 04/08/20 encounter (Appointment) with Richardo Priest, MD.   Current Facility-Administered Medications for the 04/08/20 encounter (Appointment) with Richardo Priest, MD  Medication  . 0.9 %  sodium chloride infusion     Allergies:   Amitriptyline, Bacitracin-neomycin-polymyxin, Gabapentin, Latex, Morphine and related, Morpholine salicylate, Neomycin-bacitracin zn-polymyx, Other, Pregabalin, and Topiramate   Social History   Socioeconomic History  . Marital status: Married    Spouse name: Not on file  . Number of children: Not on file  . Years of education: Not on file  . Highest education level: Not on file  Occupational History  . Not on file  Tobacco Use  . Smoking status: Current Every Day Smoker  . Smokeless tobacco: Never Used  Substance and Sexual Activity  . Alcohol use: Not Currently  . Drug use: Never  . Sexual activity: Not  on file  Other Topics Concern  . Not on file  Social History Narrative  . Not on file   Social Determinants of Health   Financial Resource Strain:   . Difficulty of Paying Living Expenses:   Food Insecurity:   . Worried About Charity fundraiser in the Last Year:   . Arboriculturist in the Last Year:   Transportation Needs:   . Film/video editor (Medical):   Marland Kitchen Lack of Transportation (Non-Medical):   Physical Activity:   . Days of Exercise per Week:   . Minutes of Exercise per Session:   Stress:   . Feeling of Stress :   Social Connections:   . Frequency of Communication with Friends and Family:   . Frequency of Social Gatherings with Friends and Family:   . Attends Religious Services:   . Active Member of Clubs or Organizations:   .  Attends Archivist Meetings:   Marland Kitchen Marital Status:      Family History: The patient's ***family history includes Colon cancer in her cousin and maternal uncle.  ROS:   ROS Please see the history of present illness.    *** All other systems reviewed and are negative.  EKGs/Labs/Other Studies Reviewed:    The following studies were reviewed today: ***  EKG:  EKG is *** ordered today.  The ekg ordered today is personally reviewed and demonstrates ***  Recent Labs: 06/26/2019: ALT 8; BUN 15; Creatinine, Ser 0.77; Hemoglobin 14.2; Platelets 281.0; Potassium 4.0; Sodium 139  Recent Lipid Panel No results found for: CHOL, TRIG, HDL, CHOLHDL, VLDL, LDLCALC, LDLDIRECT  Physical Exam:    VS:  There were no vitals taken for this visit.    Wt Readings from Last 3 Encounters:  06/26/19 171 lb 4 oz (77.7 kg)  05/16/19 168 lb (76.2 kg)  04/24/19 168 lb (76.2 kg)     GEN: *** Well nourished, well developed in no acute distress HEENT: Normal NECK: No JVD; No carotid bruits LYMPHATICS: No lymphadenopathy CARDIAC: ***RRR, no murmurs, rubs, gallops RESPIRATORY:  Clear to auscultation without rales, wheezing or rhonchi  ABDOMEN:  Soft, non-tender, non-distended MUSCULOSKELETAL:  No edema; No deformity  SKIN: Warm and dry NEUROLOGIC:  Alert and oriented x 3 PSYCHIATRIC:  Normal affect     Signed, Shirlee More, MD  04/07/2020 12:54 PM    [place]

## 2020-04-08 ENCOUNTER — Ambulatory Visit: Payer: Medicare Other | Admitting: Cardiology

## 2020-04-27 ENCOUNTER — Ambulatory Visit: Payer: Medicare Other | Admitting: Podiatry

## 2020-04-30 ENCOUNTER — Ambulatory Visit: Payer: Medicare Other | Admitting: Cardiology

## 2020-04-30 NOTE — Progress Notes (Deleted)
Cardiology Office Note:    Date:  04/30/2020   ID:  Nancy Cervantes, DOB Mar 18, 1961, MRN [id number]  PCP:  Greig Right, MD  Cardiologist:  Shirlee More, MD   Referring MD: Greig Right, MD  ASSESSMENT:    No diagnosis found. PLAN:    In order of problems listed above:  1. ***  Next appointment   Medication Adjustments/Labs and Tests Ordered: Current medicines are reviewed at length with the patient today.  Concerns regarding medicines are outlined above.  No orders of the defined types were placed in this encounter.  No orders of the defined types were placed in this encounter.    No chief complaint on file. ***  History of Present Illness:    Nancy Cervantes is a 59 y.o. female with COPD and chronic low back pain who is being seen today for the evaluation of *** at the request of Greig Right, MD.   Past Medical History:  Diagnosis Date  . Anemia   . Anxiety   . Blood transfusion without reported diagnosis 2003   after "bleeding ulcer"  . Burning sensation 02/05/2015  . Cataract    bilateral (no surgery)  . Chronic bilateral low back pain with bilateral sciatica 01/16/2012   Last Assessment & Plan:  Formatting of this note might be different from the original. 59 year old female with chronic low back pain, and now reporting radiation of pain to the hips today.  We reviewed previous plain films of the lumbar spine were completed in 2016 showing mild degenerative facet joint changes at L4-5 and L5-S1.  No compression fracture or significant disc disease.  She has no wea  . Chronic pain of both shoulders 02/08/2019   Last Assessment & Plan:  Formatting of this note might be different from the original. Patient has had a recent intra-articular injections at another clinic with good benefit.  . Depression   . Gastritis 06/03/2019   Last Assessment & Plan:  Formatting of this note might be different from the original. Intolerant to NSAIDs due to gastritis.  Last Assessment &  Plan:  Formatting of this note might be different from the original. Patient recently underwent EGD and was found to have gastritis as well as Candida esophagitis.  We will discontinue diclofenac.  Marland Kitchen GERD (gastroesophageal reflux disease)   . Gross hematuria 01/23/2018  . Hx of gastric ulcer 05/19/2016  . Hypothyroidism, unspecified 08/17/2016  . Idiopathic peripheral neuropathy 04/24/2018  . Insomnia 01/13/2016   Last Assessment & Plan:  Formatting of this note might be different from the original. Failed to respond to trazodone and amitriptyline  Last Assessment & Plan:  Formatting of this note might be different from the original. Intolerant to both trazodone Amitriptyline.  Doing well with recent addition of melatonin per PCP.  . Leg pain, bilateral 04/24/2018  . Lightheadedness 04/17/2018  . Lumbar radiculopathy 01/16/2012  . Neck pain 02/08/2019   Last Assessment & Plan:  Formatting of this note might be different from the original. Patient reports being status post ACDF in January.  . Neuromuscular disorder ([place]) 2005   peripheral vascular disease  . Neuropathy of lower extremity 01/16/2012  . Osteoarthritis   . Pain medication agreement 02/08/2019   Last Assessment & Plan:  Formatting of this note might be different from the original. UDS completed today.  Previous UDS results reviewed, 1/4 abnormal.  Will continue to monitor.  [place] controlled substance registry reviewed and there were no inconsistencies noted.  Last  Assessment & Plan:  Formatting of this note might be different from the original. Review of prior UDS results are fou  . Restless leg syndrome   . Restless legs 01/23/2018   February 05, 2015 2:43 PM Restless legs. Pt has multiple complaints but the main one is restless legs with burning pain. Looking through the records she was seen since 2008 by neurology at [facility] for the same issues. She has been on multiple medications and had extensive testing with minimal results. The last  neurologist was Dr. Macario Carls in [place] but I don't have those records. She is on requip at 0.5mg /0.7  . Right flank pain 04/19/2017  . S/P cervical spinal fusion 06/03/2019   Last Assessment & Plan:  Formatting of this note might be different from the original. History of cervical fusion in January of 2020. Stable and doing well at this time.  Last Assessment & Plan:  Formatting of this note might be different from the original. A little over 1 year status post cervical fusion, doing well without any progression of symptoms.  Continue chronic regimen as outlined in chr    Past Surgical History:  Procedure Laterality Date  . bilateral tubal ligation    . bladder mesh  2011  . CHOLECYSTECTOMY    . EGD  08/28/2012   Small active ulcer. Deformed antrum from prior ulcer. Hiatal hernia.   . Right and Left foot surgeries    . SPINAL CORD STIMULATOR IMPLANT     2009, 2011 by Dr [employer]  . SPINAL CORD STIMULATOR REMOVAL     2009 and 2011  . SPINAL FUSION  12/17/2018   [facility]    Current Medications: No outpatient medications have been marked as taking for the 04/30/20 encounter (Appointment) with Richardo Priest, MD.   Current Facility-Administered Medications for the 04/30/20 encounter (Appointment) with Richardo Priest, MD  Medication  . 0.9 %  sodium chloride infusion     Allergies:   Amitriptyline, Bacitracin-neomycin-polymyxin, Gabapentin, Latex, Morphine and related, Morpholine salicylate, Neomycin-bacitracin zn-polymyx, Other, Pregabalin, and Topiramate   Social History   Socioeconomic History  . Marital status: Married    Spouse name: Not on file  . Number of children: Not on file  . Years of education: Not on file  . Highest education level: Not on file  Occupational History  . Not on file  Tobacco Use  . Smoking status: Current Every Day Smoker  . Smokeless tobacco: Never Used  Vaping Use  . Vaping Use: Never used  Substance and Sexual Activity  . Alcohol use: Not  Currently  . Drug use: Never  . Sexual activity: Not on file  Other Topics Concern  . Not on file  Social History Narrative  . Not on file   Social Determinants of Health   Financial Resource Strain:   . Difficulty of Paying Living Expenses:   Food Insecurity:   . Worried About Charity fundraiser in the Last Year:   . Arboriculturist in the Last Year:   Transportation Needs:   . Film/video editor (Medical):   Marland Kitchen Lack of Transportation (Non-Medical):   Physical Activity:   . Days of Exercise per Week:   . Minutes of Exercise per Session:   Stress:   . Feeling of Stress :   Social Connections:   . Frequency of Communication with Friends and Family:   . Frequency of Social Gatherings with Friends and Family:   . Attends Religious  Services:   . Active Member of Clubs or Organizations:   . Attends Archivist Meetings:   Marland Kitchen Marital Status:      Family History: The patient's ***family history includes Colon cancer in her cousin and maternal uncle.  ROS:   ROS Please see the history of present illness.    *** All other systems reviewed and are negative.  EKGs/Labs/Other Studies Reviewed:    The following studies were reviewed today: ***  EKG:  EKG is *** ordered today.  The ekg ordered today is personally reviewed and demonstrates ***  Recent Labs: 06/26/2019: ALT 8; BUN 15; Creatinine, Ser 0.77; Hemoglobin 14.2; Platelets 281.0; Potassium 4.0; Sodium 139  Recent Lipid Panel No results found for: CHOL, TRIG, HDL, CHOLHDL, VLDL, LDLCALC, LDLDIRECT  Physical Exam:    VS:  There were no vitals taken for this visit.    Wt Readings from Last 3 Encounters:  06/26/19 171 lb 4 oz (77.7 kg)  05/16/19 168 lb (76.2 kg)  04/24/19 168 lb (76.2 kg)     GEN: *** Well nourished, well developed in no acute distress HEENT: Normal NECK: No JVD; No carotid bruits LYMPHATICS: No lymphadenopathy CARDIAC: ***RRR, no murmurs, rubs, gallops RESPIRATORY:  Clear to  auscultation without rales, wheezing or rhonchi  ABDOMEN: Soft, non-tender, non-distended MUSCULOSKELETAL:  No edema; No deformity  SKIN: Warm and dry NEUROLOGIC:  Alert and oriented x 3 PSYCHIATRIC:  Normal affect     Signed, Shirlee More, MD  04/30/2020 12:35 PM    [facility]

## 2020-05-16 NOTE — Progress Notes (Signed)
  Subjective:  Patient ID: Nancy Cervantes, female    DOB: 06-27-1961,  MRN: [id number]  Chief Complaint  Patient presents with  . Foot Pain    BL foot pain "all over"  and a knot at Lt back heel x years; 10/10 shapr constant pain -w/ swelling -wrose with walking -w/ burning and gait problem tx: none    59 y.o. female presents with the above complaint. History confirmed with patient.   Objective:  Physical Exam: warm, good capillary refill, no trophic changes or ulcerative lesions, normal DP and PT pulses and normal sensory exam. Left Foot: tenderness to palpation medial calcaneal tuber, no pain with calcaneal squeeze, decreased ankle joint ROM and +Silverskiold test  Radiographs: X-ray of the left foot: no evidence of calcaneal stress fracture, plantar calcaneal spur, posterior calcaneal spur and Haglund deformity noted  Assessment:   1. Achilles tendinitis of left lower extremity   2. Equinus deformity of foot   3. Calcaneal spur of left foot      Plan:  Patient was evaluated and treated and all questions answered.  Achilles Tendonitis -XR reviewed with patient -Educated on stretching and icing of the affected limb.  -Dispensed cam boot for the left ankle  No follow-ups on file.

## 2020-05-17 ENCOUNTER — Other Ambulatory Visit: Payer: Self-pay | Admitting: Gastroenterology

## 2020-05-17 DIAGNOSIS — K219 Gastro-esophageal reflux disease without esophagitis: Secondary | ICD-10-CM

## 2020-05-26 DIAGNOSIS — M5441 Lumbago with sciatica, right side: Secondary | ICD-10-CM | POA: Diagnosis not present

## 2020-05-26 DIAGNOSIS — Z981 Arthrodesis status: Secondary | ICD-10-CM | POA: Diagnosis not present

## 2020-05-26 DIAGNOSIS — M47812 Spondylosis without myelopathy or radiculopathy, cervical region: Secondary | ICD-10-CM | POA: Diagnosis not present

## 2020-05-26 DIAGNOSIS — M542 Cervicalgia: Secondary | ICD-10-CM | POA: Diagnosis not present

## 2020-05-26 DIAGNOSIS — G8929 Other chronic pain: Secondary | ICD-10-CM | POA: Diagnosis not present

## 2020-05-26 DIAGNOSIS — M4802 Spinal stenosis, cervical region: Secondary | ICD-10-CM | POA: Diagnosis not present

## 2020-05-26 DIAGNOSIS — Z79891 Long term (current) use of opiate analgesic: Secondary | ICD-10-CM | POA: Diagnosis not present

## 2020-05-26 DIAGNOSIS — M5442 Lumbago with sciatica, left side: Secondary | ICD-10-CM | POA: Diagnosis not present

## 2020-05-26 DIAGNOSIS — M5412 Radiculopathy, cervical region: Secondary | ICD-10-CM | POA: Diagnosis not present

## 2020-06-02 ENCOUNTER — Other Ambulatory Visit: Payer: Self-pay | Admitting: Podiatry

## 2020-06-02 DIAGNOSIS — M7662 Achilles tendinitis, left leg: Secondary | ICD-10-CM

## 2020-06-02 DIAGNOSIS — M7661 Achilles tendinitis, right leg: Secondary | ICD-10-CM

## 2020-06-03 ENCOUNTER — Other Ambulatory Visit: Payer: Self-pay

## 2020-06-03 ENCOUNTER — Encounter: Payer: Self-pay | Admitting: Sports Medicine

## 2020-06-03 ENCOUNTER — Ambulatory Visit (INDEPENDENT_AMBULATORY_CARE_PROVIDER_SITE_OTHER): Payer: Medicare Other | Admitting: Sports Medicine

## 2020-06-03 ENCOUNTER — Ambulatory Visit (INDEPENDENT_AMBULATORY_CARE_PROVIDER_SITE_OTHER): Payer: Medicare Other

## 2020-06-03 DIAGNOSIS — M216X9 Other acquired deformities of unspecified foot: Secondary | ICD-10-CM | POA: Diagnosis not present

## 2020-06-03 DIAGNOSIS — M7732 Calcaneal spur, left foot: Secondary | ICD-10-CM

## 2020-06-03 DIAGNOSIS — M7662 Achilles tendinitis, left leg: Secondary | ICD-10-CM

## 2020-06-03 DIAGNOSIS — M79671 Pain in right foot: Secondary | ICD-10-CM

## 2020-06-03 DIAGNOSIS — M779 Enthesopathy, unspecified: Secondary | ICD-10-CM | POA: Diagnosis not present

## 2020-06-03 DIAGNOSIS — M7661 Achilles tendinitis, right leg: Secondary | ICD-10-CM

## 2020-06-03 DIAGNOSIS — G8929 Other chronic pain: Secondary | ICD-10-CM

## 2020-06-03 DIAGNOSIS — M79672 Pain in left foot: Secondary | ICD-10-CM

## 2020-06-03 MED ORDER — PREDNISONE 10 MG (21) PO TBPK
ORAL_TABLET | ORAL | 0 refills | Status: AC
Start: 2020-06-03 — End: ?

## 2020-06-03 NOTE — Progress Notes (Signed)
Subjective: Nancy Cervantes is a 59 y.o. female patient who presents to office for evaluation of bilateral foot pain right now greater than left reports that the pain is on the bottom and the medial side of the heel that radiates to the back reports that she felt a pop on last Tuesday and afterwards had 10 out of 10 pain reports that her left foot the same pain several months ago where she had increased pain with burning and reports that she has been wearing cam boot but it still hurts on the left now with knee pain on the right.  Patient reports 10 out of 10 sharp constant pain with swelling and also reports that she has been icing and stretching without any relief.  Patient admits to an extensive history of foot pain and problems and pain that has been so bad that at 1 point she was almost considered to need a wheelchair.  Patient admits a significant history of being treated in the past by Dr. Blenda Mounts.  No other pedal complaints noted.  Patient Active Problem List   Diagnosis Date Noted   Gastritis 06/03/2019   S/P cervical spinal fusion 06/03/2019   Chronic pain of both shoulders 02/08/2019   Neck pain 02/08/2019   Pain medication agreement 02/08/2019   Idiopathic peripheral neuropathy 04/24/2018   Leg pain, bilateral 04/24/2018   Lightheadedness 04/17/2018   Gross hematuria 01/23/2018   Restless legs 01/23/2018   Right flank pain 04/19/2017   Depression 08/29/2016   Hypothyroidism, unspecified 08/17/2016   Hx of gastric ulcer 05/19/2016   Insomnia 01/13/2016   Burning sensation 02/05/2015   Chronic bilateral low back pain with bilateral sciatica 01/16/2012   Lumbar radiculopathy 01/16/2012   Neuropathy of lower extremity 01/16/2012    Current Outpatient Medications on File Prior to Visit  Medication Sig Dispense Refill   atorvastatin (LIPITOR) 20 MG tablet Take 20 mg by mouth daily.     esomeprazole (NEXIUM) 40 MG capsule TAKE 1 CAPSULE (40 MG TOTAL) BY MOUTH 2 (TWO)  TIMES DAILY BEFORE A MEAL. 180 capsule 1   Melatonin 10 MG TABS Take 15 mg by mouth at bedtime.     sucralfate (CARAFATE) 1 GM/10ML suspension Take 10 mLs (1 g total) by mouth 2 (two) times daily for 28 doses. 280 mL 0   Current Facility-Administered Medications on File Prior to Visit  Medication Dose Route Frequency Provider Last Rate Last Admin   0.9 %  sodium chloride infusion  500 mL Intravenous Once Jackquline Denmark, MD        Allergies  Allergen Reactions   Amitriptyline    Bacitracin-Neomycin-Polymyxin Rash   Gabapentin Itching and Nausea And Vomiting   Latex Rash   Morphine And Related Palpitations   Morpholine Salicylate Palpitations   Neomycin-Bacitracin Zn-Polymyx Rash   Other Itching   Pregabalin Nausea And Vomiting   Topiramate Rash    Objective:  General: Alert and oriented x3 in no acute distress  Dermatology: No open lesions bilateral lower extremities, no webspace macerations, no ecchymosis bilateral, all nails x 10 are well manicured.  Vascular: Dorsalis Pedis and Posterior Tibial pedal pulses palpable, Capillary Fill Time 3 seconds,(+) pedal hair growth bilateral, varicosities noted bilateral, trace edema bilateral ankles.  Neurology: Johney Maine sensation intact via light touch bilateral.   Musculoskeletal: Mild to moderate tenderness with palpation at the Achilles insertion right greater than left as well as the plantar fascial insertion there is also subjective pain along the posterior tibial tendon course with  patient has seen some significant swelling and also felt a pop in this area.  Patient has a fixed pes planus deformity with limited range of motion especially at the midfoot and ankle.  Gait: Antalgic gait  Xrays  Right severe end-stage arthritis and on the left foot previous x-rays were reviewed revealing significant end-stage arthritis with posterior spur and Haglund's deformity.  Assessment and Plan: Problem List Items Addressed This Visit       Other   Chronic pain of both shoulders   Relevant Medications   predniSONE (STERAPRED UNI-PAK 21 TAB) 10 MG (21) TBPK tablet    Other Visit Diagnoses    Tendonitis    -  Primary   Achilles tendinitis of left lower extremity       Tendonitis, Achilles, right       Relevant Orders   DG Foot Complete Right   Equinus deformity of foot       Calcaneal spur of left foot           -Complete examination performed -Xrays reviewed -Discussed treatment options for chronic foot pain and likely acute on chronic tendinitis versus tear -Rx prednisone Dosepak to take as instructed -Recommend continue with rest ice elevation and avoid strenuous activity advised patient now use the cam boot that she has on the right foot and dispensed heel lift for the left -Order musculoskeletal ultrasound to further evaluate for any tendinous involvement -Patient to return to office after ultrasound or sooner if condition worsens.  Landis Martins, DPM

## 2020-06-04 ENCOUNTER — Telehealth: Payer: Self-pay | Admitting: *Deleted

## 2020-06-04 DIAGNOSIS — M722 Plantar fascial fibromatosis: Secondary | ICD-10-CM

## 2020-06-04 DIAGNOSIS — M779 Enthesopathy, unspecified: Secondary | ICD-10-CM

## 2020-06-04 DIAGNOSIS — M7662 Achilles tendinitis, left leg: Secondary | ICD-10-CM

## 2020-06-04 NOTE — Telephone Encounter (Signed)
-----   Message from Landis Martins, [place] sent at 06/03/2020  7:37 PM EDT ----- Regarding: [facility] Ultrasound Right and left heel Eval plantar fascias, PT tendon and achilles bilateral for possible acute on chronic tear

## 2020-06-23 DIAGNOSIS — M79671 Pain in right foot: Secondary | ICD-10-CM | POA: Diagnosis not present

## 2020-06-23 DIAGNOSIS — M5442 Lumbago with sciatica, left side: Secondary | ICD-10-CM | POA: Diagnosis not present

## 2020-06-23 DIAGNOSIS — M5441 Lumbago with sciatica, right side: Secondary | ICD-10-CM | POA: Diagnosis not present

## 2020-06-23 DIAGNOSIS — G8929 Other chronic pain: Secondary | ICD-10-CM | POA: Diagnosis not present

## 2020-06-23 DIAGNOSIS — M79672 Pain in left foot: Secondary | ICD-10-CM | POA: Diagnosis not present

## 2020-06-23 DIAGNOSIS — Z79891 Long term (current) use of opiate analgesic: Secondary | ICD-10-CM | POA: Diagnosis not present

## 2020-07-16 ENCOUNTER — Other Ambulatory Visit: Payer: Medicare Other

## 2020-07-22 ENCOUNTER — Ambulatory Visit
Admission: RE | Admit: 2020-07-22 | Discharge: 2020-07-22 | Disposition: A | Discharge status: Home / Self Care | Payer: Medicare Other | Source: Ambulatory Visit | Attending: Sports Medicine | Admitting: Sports Medicine

## 2020-07-22 DIAGNOSIS — M722 Plantar fascial fibromatosis: Secondary | ICD-10-CM | POA: Diagnosis not present

## 2020-07-22 DIAGNOSIS — M7662 Achilles tendinitis, left leg: Secondary | ICD-10-CM

## 2020-07-22 DIAGNOSIS — S86011A Strain of right Achilles tendon, initial encounter: Secondary | ICD-10-CM | POA: Diagnosis not present

## 2020-07-22 DIAGNOSIS — M779 Enthesopathy, unspecified: Secondary | ICD-10-CM

## 2020-09-07 DIAGNOSIS — J0100 Acute maxillary sinusitis, unspecified: Secondary | ICD-10-CM | POA: Diagnosis not present

## 2020-09-07 DIAGNOSIS — Z03818 Encounter for observation for suspected exposure to other biological agents ruled out: Secondary | ICD-10-CM | POA: Diagnosis not present

## 2020-09-22 DIAGNOSIS — G8929 Other chronic pain: Secondary | ICD-10-CM | POA: Diagnosis not present

## 2020-09-22 DIAGNOSIS — Z79891 Long term (current) use of opiate analgesic: Secondary | ICD-10-CM | POA: Diagnosis not present

## 2020-09-22 DIAGNOSIS — M5442 Lumbago with sciatica, left side: Secondary | ICD-10-CM | POA: Diagnosis not present

## 2020-09-22 DIAGNOSIS — M542 Cervicalgia: Secondary | ICD-10-CM | POA: Diagnosis not present

## 2020-09-22 DIAGNOSIS — Z981 Arthrodesis status: Secondary | ICD-10-CM | POA: Diagnosis not present

## 2020-09-22 DIAGNOSIS — M549 Dorsalgia, unspecified: Secondary | ICD-10-CM | POA: Diagnosis not present

## 2020-09-22 DIAGNOSIS — M5441 Lumbago with sciatica, right side: Secondary | ICD-10-CM | POA: Diagnosis not present

## 2020-09-22 DIAGNOSIS — M47816 Spondylosis without myelopathy or radiculopathy, lumbar region: Secondary | ICD-10-CM | POA: Diagnosis not present

## 2020-10-03 DIAGNOSIS — R29898 Other symptoms and signs involving the musculoskeletal system: Secondary | ICD-10-CM | POA: Diagnosis not present

## 2020-10-03 DIAGNOSIS — R410 Disorientation, unspecified: Secondary | ICD-10-CM | POA: Diagnosis not present

## 2020-10-03 DIAGNOSIS — R4020 Unspecified coma: Secondary | ICD-10-CM | POA: Diagnosis not present

## 2020-10-03 DIAGNOSIS — R52 Pain, unspecified: Secondary | ICD-10-CM | POA: Diagnosis not present

## 2020-10-03 DIAGNOSIS — G4489 Other headache syndrome: Secondary | ICD-10-CM | POA: Diagnosis not present

## 2020-10-03 DIAGNOSIS — R404 Transient alteration of awareness: Secondary | ICD-10-CM | POA: Diagnosis not present

## 2020-10-05 DIAGNOSIS — R519 Headache, unspecified: Secondary | ICD-10-CM | POA: Diagnosis not present

## 2020-10-05 DIAGNOSIS — R55 Syncope and collapse: Secondary | ICD-10-CM | POA: Diagnosis not present

## 2020-10-05 DIAGNOSIS — N3000 Acute cystitis without hematuria: Secondary | ICD-10-CM | POA: Diagnosis not present

## 2020-10-31 ENCOUNTER — Other Ambulatory Visit: Payer: Self-pay | Admitting: Gastroenterology

## 2020-10-31 DIAGNOSIS — K219 Gastro-esophageal reflux disease without esophagitis: Secondary | ICD-10-CM

## 2020-11-02 DIAGNOSIS — K219 Gastro-esophageal reflux disease without esophagitis: Secondary | ICD-10-CM | POA: Diagnosis not present

## 2020-11-02 DIAGNOSIS — I708 Atherosclerosis of other arteries: Secondary | ICD-10-CM | POA: Diagnosis not present

## 2020-11-02 DIAGNOSIS — Z0000 Encounter for general adult medical examination without abnormal findings: Secondary | ICD-10-CM | POA: Diagnosis not present

## 2020-12-02 DIAGNOSIS — R059 Cough, unspecified: Secondary | ICD-10-CM | POA: Diagnosis not present

## 2020-12-02 DIAGNOSIS — J069 Acute upper respiratory infection, unspecified: Secondary | ICD-10-CM | POA: Diagnosis not present

## 2020-12-02 DIAGNOSIS — R509 Fever, unspecified: Secondary | ICD-10-CM | POA: Diagnosis not present

## 2020-12-10 DIAGNOSIS — Z20822 Contact with and (suspected) exposure to covid-19: Secondary | ICD-10-CM | POA: Diagnosis not present

## 2020-12-22 DIAGNOSIS — Z981 Arthrodesis status: Secondary | ICD-10-CM | POA: Diagnosis not present

## 2020-12-22 DIAGNOSIS — M542 Cervicalgia: Secondary | ICD-10-CM | POA: Diagnosis not present

## 2020-12-22 DIAGNOSIS — G8929 Other chronic pain: Secondary | ICD-10-CM | POA: Diagnosis not present

## 2020-12-22 DIAGNOSIS — Z760 Encounter for issue of repeat prescription: Secondary | ICD-10-CM | POA: Diagnosis not present

## 2020-12-22 DIAGNOSIS — M7062 Trochanteric bursitis, left hip: Secondary | ICD-10-CM | POA: Diagnosis not present

## 2020-12-22 DIAGNOSIS — M5442 Lumbago with sciatica, left side: Secondary | ICD-10-CM | POA: Diagnosis not present

## 2020-12-22 DIAGNOSIS — Z79891 Long term (current) use of opiate analgesic: Secondary | ICD-10-CM | POA: Diagnosis not present

## 2020-12-22 DIAGNOSIS — M5441 Lumbago with sciatica, right side: Secondary | ICD-10-CM | POA: Diagnosis not present

## 2021-01-19 DIAGNOSIS — M4802 Spinal stenosis, cervical region: Secondary | ICD-10-CM | POA: Diagnosis not present

## 2021-01-19 DIAGNOSIS — Z79891 Long term (current) use of opiate analgesic: Secondary | ICD-10-CM | POA: Diagnosis not present

## 2021-01-19 DIAGNOSIS — M5441 Lumbago with sciatica, right side: Secondary | ICD-10-CM | POA: Diagnosis not present

## 2021-01-19 DIAGNOSIS — M25511 Pain in right shoulder: Secondary | ICD-10-CM | POA: Diagnosis not present

## 2021-01-19 DIAGNOSIS — Z760 Encounter for issue of repeat prescription: Secondary | ICD-10-CM | POA: Diagnosis not present

## 2021-01-19 DIAGNOSIS — G8929 Other chronic pain: Secondary | ICD-10-CM | POA: Diagnosis not present

## 2021-01-19 DIAGNOSIS — M7062 Trochanteric bursitis, left hip: Secondary | ICD-10-CM | POA: Diagnosis not present

## 2021-01-19 DIAGNOSIS — M5412 Radiculopathy, cervical region: Secondary | ICD-10-CM | POA: Diagnosis not present

## 2021-01-19 DIAGNOSIS — M5442 Lumbago with sciatica, left side: Secondary | ICD-10-CM | POA: Diagnosis not present

## 2021-01-19 DIAGNOSIS — M7061 Trochanteric bursitis, right hip: Secondary | ICD-10-CM | POA: Diagnosis not present

## 2021-04-14 DIAGNOSIS — H16143 Punctate keratitis, bilateral: Secondary | ICD-10-CM | POA: Diagnosis not present

## 2021-04-14 DIAGNOSIS — H18413 Arcus senilis, bilateral: Secondary | ICD-10-CM | POA: Diagnosis not present

## 2021-04-27 DIAGNOSIS — M5441 Lumbago with sciatica, right side: Secondary | ICD-10-CM | POA: Diagnosis not present

## 2021-04-27 DIAGNOSIS — M5442 Lumbago with sciatica, left side: Secondary | ICD-10-CM | POA: Diagnosis not present

## 2021-04-27 DIAGNOSIS — M7062 Trochanteric bursitis, left hip: Secondary | ICD-10-CM | POA: Diagnosis not present

## 2021-04-27 DIAGNOSIS — M542 Cervicalgia: Secondary | ICD-10-CM | POA: Diagnosis not present

## 2021-04-27 DIAGNOSIS — M7061 Trochanteric bursitis, right hip: Secondary | ICD-10-CM | POA: Diagnosis not present

## 2021-04-27 DIAGNOSIS — Z79891 Long term (current) use of opiate analgesic: Secondary | ICD-10-CM | POA: Diagnosis not present

## 2021-04-27 DIAGNOSIS — M79672 Pain in left foot: Secondary | ICD-10-CM | POA: Diagnosis not present

## 2021-04-27 DIAGNOSIS — Z981 Arthrodesis status: Secondary | ICD-10-CM | POA: Diagnosis not present

## 2021-04-27 DIAGNOSIS — M79671 Pain in right foot: Secondary | ICD-10-CM | POA: Diagnosis not present

## 2021-04-27 DIAGNOSIS — Z760 Encounter for issue of repeat prescription: Secondary | ICD-10-CM | POA: Diagnosis not present

## 2021-04-27 DIAGNOSIS — G8929 Other chronic pain: Secondary | ICD-10-CM | POA: Diagnosis not present

## 2021-06-21 DIAGNOSIS — R748 Abnormal levels of other serum enzymes: Secondary | ICD-10-CM | POA: Diagnosis not present

## 2021-06-21 DIAGNOSIS — M25551 Pain in right hip: Secondary | ICD-10-CM | POA: Diagnosis not present

## 2021-06-21 DIAGNOSIS — M25552 Pain in left hip: Secondary | ICD-10-CM | POA: Diagnosis not present

## 2021-06-21 DIAGNOSIS — Z7689 Persons encountering health services in other specified circumstances: Secondary | ICD-10-CM | POA: Diagnosis not present

## 2021-06-21 DIAGNOSIS — Z1321 Encounter for screening for nutritional disorder: Secondary | ICD-10-CM | POA: Diagnosis not present

## 2021-06-21 DIAGNOSIS — Z1329 Encounter for screening for other suspected endocrine disorder: Secondary | ICD-10-CM | POA: Diagnosis not present

## 2021-06-21 DIAGNOSIS — Z13220 Encounter for screening for lipoid disorders: Secondary | ICD-10-CM | POA: Diagnosis not present

## 2021-06-24 DIAGNOSIS — R319 Hematuria, unspecified: Secondary | ICD-10-CM | POA: Diagnosis not present

## 2021-08-03 DIAGNOSIS — Z760 Encounter for issue of repeat prescription: Secondary | ICD-10-CM | POA: Diagnosis not present

## 2021-08-03 DIAGNOSIS — Z79891 Long term (current) use of opiate analgesic: Secondary | ICD-10-CM | POA: Diagnosis not present

## 2021-08-03 DIAGNOSIS — R0989 Other specified symptoms and signs involving the circulatory and respiratory systems: Secondary | ICD-10-CM | POA: Diagnosis not present

## 2021-08-03 DIAGNOSIS — M5441 Lumbago with sciatica, right side: Secondary | ICD-10-CM | POA: Diagnosis not present

## 2021-08-03 DIAGNOSIS — M7061 Trochanteric bursitis, right hip: Secondary | ICD-10-CM | POA: Diagnosis not present

## 2021-08-03 DIAGNOSIS — G8929 Other chronic pain: Secondary | ICD-10-CM | POA: Diagnosis not present

## 2021-08-03 DIAGNOSIS — M5442 Lumbago with sciatica, left side: Secondary | ICD-10-CM | POA: Diagnosis not present

## 2021-08-03 DIAGNOSIS — M7062 Trochanteric bursitis, left hip: Secondary | ICD-10-CM | POA: Diagnosis not present

## 2021-08-03 DIAGNOSIS — M25551 Pain in right hip: Secondary | ICD-10-CM | POA: Diagnosis not present

## 2021-08-09 DIAGNOSIS — Z77120 Contact with and (suspected) exposure to mold (toxic): Secondary | ICD-10-CM | POA: Diagnosis not present

## 2021-08-09 DIAGNOSIS — Z720 Tobacco use: Secondary | ICD-10-CM | POA: Diagnosis not present

## 2021-08-11 DIAGNOSIS — M85851 Other specified disorders of bone density and structure, right thigh: Secondary | ICD-10-CM | POA: Diagnosis not present

## 2021-08-11 DIAGNOSIS — M859 Disorder of bone density and structure, unspecified: Secondary | ICD-10-CM | POA: Diagnosis not present

## 2021-08-18 DIAGNOSIS — I739 Peripheral vascular disease, unspecified: Secondary | ICD-10-CM | POA: Diagnosis not present

## 2021-08-18 DIAGNOSIS — R0989 Other specified symptoms and signs involving the circulatory and respiratory systems: Secondary | ICD-10-CM | POA: Diagnosis not present

## 2021-09-06 DIAGNOSIS — F17210 Nicotine dependence, cigarettes, uncomplicated: Secondary | ICD-10-CM | POA: Diagnosis not present

## 2021-09-06 DIAGNOSIS — I2510 Atherosclerotic heart disease of native coronary artery without angina pectoris: Secondary | ICD-10-CM | POA: Diagnosis not present

## 2021-09-06 DIAGNOSIS — D3501 Benign neoplasm of right adrenal gland: Secondary | ICD-10-CM | POA: Diagnosis not present

## 2021-09-06 DIAGNOSIS — Z122 Encounter for screening for malignant neoplasm of respiratory organs: Secondary | ICD-10-CM | POA: Diagnosis not present

## 2021-09-06 DIAGNOSIS — J432 Centrilobular emphysema: Secondary | ICD-10-CM | POA: Diagnosis not present

## 2021-09-08 DIAGNOSIS — Z712 Person consulting for explanation of examination or test findings: Secondary | ICD-10-CM | POA: Diagnosis not present

## 2021-09-08 DIAGNOSIS — M25551 Pain in right hip: Secondary | ICD-10-CM | POA: Diagnosis not present

## 2021-09-08 DIAGNOSIS — M25552 Pain in left hip: Secondary | ICD-10-CM | POA: Diagnosis not present

## 2021-09-14 DIAGNOSIS — R9389 Abnormal findings on diagnostic imaging of other specified body structures: Secondary | ICD-10-CM | POA: Diagnosis not present

## 2021-09-14 DIAGNOSIS — Z760 Encounter for issue of repeat prescription: Secondary | ICD-10-CM | POA: Diagnosis not present

## 2021-09-14 DIAGNOSIS — Z79891 Long term (current) use of opiate analgesic: Secondary | ICD-10-CM | POA: Diagnosis not present

## 2021-09-14 DIAGNOSIS — M5441 Lumbago with sciatica, right side: Secondary | ICD-10-CM | POA: Diagnosis not present

## 2021-09-14 DIAGNOSIS — G8929 Other chronic pain: Secondary | ICD-10-CM | POA: Diagnosis not present

## 2021-09-14 DIAGNOSIS — Z981 Arthrodesis status: Secondary | ICD-10-CM | POA: Diagnosis not present

## 2021-09-14 DIAGNOSIS — M5442 Lumbago with sciatica, left side: Secondary | ICD-10-CM | POA: Diagnosis not present

## 2021-09-14 DIAGNOSIS — M25551 Pain in right hip: Secondary | ICD-10-CM | POA: Diagnosis not present

## 2021-09-14 DIAGNOSIS — M25552 Pain in left hip: Secondary | ICD-10-CM | POA: Diagnosis not present

## 2021-09-15 ENCOUNTER — Other Ambulatory Visit: Payer: Self-pay | Admitting: Gastroenterology

## 2021-09-15 DIAGNOSIS — K219 Gastro-esophageal reflux disease without esophagitis: Secondary | ICD-10-CM

## 2021-09-16 DIAGNOSIS — M5137 Other intervertebral disc degeneration, lumbosacral region: Secondary | ICD-10-CM | POA: Diagnosis not present

## 2021-09-16 DIAGNOSIS — M5416 Radiculopathy, lumbar region: Secondary | ICD-10-CM | POA: Diagnosis not present

## 2021-09-27 DIAGNOSIS — M48061 Spinal stenosis, lumbar region without neurogenic claudication: Secondary | ICD-10-CM | POA: Diagnosis not present

## 2021-09-27 DIAGNOSIS — M5126 Other intervertebral disc displacement, lumbar region: Secondary | ICD-10-CM | POA: Diagnosis not present

## 2021-09-27 DIAGNOSIS — M5136 Other intervertebral disc degeneration, lumbar region: Secondary | ICD-10-CM | POA: Diagnosis not present

## 2021-09-27 DIAGNOSIS — M47816 Spondylosis without myelopathy or radiculopathy, lumbar region: Secondary | ICD-10-CM | POA: Diagnosis not present

## 2021-09-27 DIAGNOSIS — M5137 Other intervertebral disc degeneration, lumbosacral region: Secondary | ICD-10-CM | POA: Diagnosis not present

## 2021-09-27 DIAGNOSIS — M160 Bilateral primary osteoarthritis of hip: Secondary | ICD-10-CM | POA: Diagnosis not present

## 2021-09-27 DIAGNOSIS — M5416 Radiculopathy, lumbar region: Secondary | ICD-10-CM | POA: Diagnosis not present

## 2021-10-01 DIAGNOSIS — M5416 Radiculopathy, lumbar region: Secondary | ICD-10-CM | POA: Diagnosis not present

## 2021-10-01 DIAGNOSIS — M25551 Pain in right hip: Secondary | ICD-10-CM | POA: Diagnosis not present

## 2021-10-01 DIAGNOSIS — M25552 Pain in left hip: Secondary | ICD-10-CM | POA: Diagnosis not present

## 2021-10-19 DIAGNOSIS — Z760 Encounter for issue of repeat prescription: Secondary | ICD-10-CM | POA: Diagnosis not present

## 2021-10-19 DIAGNOSIS — M25552 Pain in left hip: Secondary | ICD-10-CM | POA: Diagnosis not present

## 2021-10-19 DIAGNOSIS — G8929 Other chronic pain: Secondary | ICD-10-CM | POA: Diagnosis not present

## 2021-10-19 DIAGNOSIS — M5441 Lumbago with sciatica, right side: Secondary | ICD-10-CM | POA: Diagnosis not present

## 2021-10-19 DIAGNOSIS — Z720 Tobacco use: Secondary | ICD-10-CM | POA: Diagnosis not present

## 2021-10-19 DIAGNOSIS — M79672 Pain in left foot: Secondary | ICD-10-CM | POA: Diagnosis not present

## 2021-10-19 DIAGNOSIS — Z79891 Long term (current) use of opiate analgesic: Secondary | ICD-10-CM | POA: Diagnosis not present

## 2021-10-19 DIAGNOSIS — Z981 Arthrodesis status: Secondary | ICD-10-CM | POA: Diagnosis not present

## 2021-10-19 DIAGNOSIS — M79671 Pain in right foot: Secondary | ICD-10-CM | POA: Diagnosis not present

## 2021-10-19 DIAGNOSIS — M542 Cervicalgia: Secondary | ICD-10-CM | POA: Diagnosis not present

## 2021-10-19 DIAGNOSIS — M25551 Pain in right hip: Secondary | ICD-10-CM | POA: Diagnosis not present

## 2021-10-19 DIAGNOSIS — M5442 Lumbago with sciatica, left side: Secondary | ICD-10-CM | POA: Diagnosis not present

## 2021-12-12 ENCOUNTER — Other Ambulatory Visit: Payer: Self-pay | Admitting: Gastroenterology

## 2021-12-12 DIAGNOSIS — K219 Gastro-esophageal reflux disease without esophagitis: Secondary | ICD-10-CM

## 2023-12-15 ENCOUNTER — Emergency Department (HOSPITAL_BASED_OUTPATIENT_CLINIC_OR_DEPARTMENT_OTHER)
Admission: EM | Admit: 2023-12-15 | Discharge: 2023-12-16 | Discharge status: Against Medical Advice | Payer: 59 | Attending: Emergency Medicine | Admitting: Emergency Medicine

## 2023-12-15 ENCOUNTER — Other Ambulatory Visit: Payer: Self-pay

## 2023-12-15 DIAGNOSIS — L0232 Furuncle of buttock: Secondary | ICD-10-CM | POA: Diagnosis present

## 2023-12-15 DIAGNOSIS — Z5321 Procedure and treatment not carried out due to patient leaving prior to being seen by health care provider: Secondary | ICD-10-CM | POA: Diagnosis not present

## 2023-12-15 LAB — COMPREHENSIVE METABOLIC PANEL
ALT: 11 U/L (ref 0–44)
AST: 15 U/L (ref 15–41)
Albumin: 3.8 g/dL (ref 3.5–5.0)
Alkaline Phosphatase: 88 U/L (ref 38–126)
Anion gap: 7 (ref 5–15)
BUN: 17 mg/dL (ref 6–20)
CO2: 29 mmol/L (ref 22–32)
Calcium: 9 mg/dL (ref 8.9–10.3)
Chloride: 105 mmol/L (ref 98–111)
Creatinine, Ser: 0.68 mg/dL (ref 0.44–1.00)
GFR, Estimated: >60 mL/min (ref >60)
Glucose, Bld: 88 mg/dL (ref 70–99)
Potassium: 4.8 mmol/L (ref 3.5–5.1)
Sodium: 141 mmol/L (ref 135–145)
Total Bilirubin: 0.4 mg/dL (ref 0.0–1.2)
Total Protein: 6.8 g/dL (ref 6.5–8.1)

## 2023-12-15 LAB — CBC WITH DIFFERENTIAL/PLATELET
Abs Immature Granulocytes: 0.02 10*3/uL (ref 0.00–0.07)
Basophils Absolute: 0.1 10*3/uL (ref 0.0–0.1)
Basophils Relative: 1 %
Eosinophils Absolute: 0.2 10*3/uL (ref 0.0–0.5)
Eosinophils Relative: 3 %
HCT: 40.8 % (ref 36.0–46.0)
Hemoglobin: 13.6 g/dL (ref 12.0–15.0)
Immature Granulocytes: 0 %
Lymphocytes Relative: 32 %
Lymphs Abs: 2.5 10*3/uL (ref 0.7–4.0)
MCH: 30.4 pg (ref 26.0–34.0)
MCHC: 33.3 g/dL (ref 30.0–36.0)
MCV: 91.1 fL (ref 80.0–100.0)
Monocytes Absolute: 0.9 10*3/uL (ref 0.1–1.0)
Monocytes Relative: 11 %
Neutro Abs: 4 10*3/uL (ref 1.7–7.7)
Neutrophils Relative %: 53 %
Platelets: 225 10*3/uL (ref 150–400)
RBC: 4.48 MIL/uL (ref 3.87–5.11)
RDW: 11.2 % — ABNORMAL LOW (ref 11.5–15.5)
WBC: 7.6 10*3/uL (ref 4.0–10.5)
nRBC: 0 % (ref 0.0–0.2)

## 2023-12-15 LAB — LACTIC ACID, PLASMA: Lactic Acid, Venous: 1.4 mmol/L (ref 0.5–1.9)

## 2023-12-15 NOTE — ED Triage Notes (Signed)
Pt POV from home reporting boil on R buttocks. Boil drained at this time, redness noted to area. Also reports sleeping with "hot potato" on area to get rid of infection.

## 2023-12-16 NOTE — ED Notes (Signed)
Pt called to be roomed with no answer.
